# Patient Record
Sex: Male | Born: 1975 | Race: Black or African American | Hispanic: No | Marital: Married
Health system: Midwestern US, Community
[De-identification: ages and names within clinical notes are randomized; demographics above are authoritative.]

## PROBLEM LIST (undated history)

## (undated) DIAGNOSIS — E079 Disorder of thyroid, unspecified: Secondary | ICD-10-CM

## (undated) DIAGNOSIS — I4891 Unspecified atrial fibrillation: Secondary | ICD-10-CM

## (undated) HISTORY — PX: APPENDECTOMY: SHX54

---

## 2016-04-10 ENCOUNTER — Encounter (HOSPITAL_COMMUNITY): Payer: Self-pay | Admitting: Emergency Medicine

## 2016-04-10 ENCOUNTER — Emergency Department (HOSPITAL_COMMUNITY)
Admission: EM | Admit: 2016-04-10 | Discharge: 2016-04-10 | Disposition: A | Discharge status: Home / Self Care | Payer: Self-pay | Attending: Emergency Medicine | Admitting: Emergency Medicine

## 2016-04-10 DIAGNOSIS — K921 Melena: Secondary | ICD-10-CM

## 2016-04-10 DIAGNOSIS — K922 Gastrointestinal hemorrhage, unspecified: Secondary | ICD-10-CM | POA: Insufficient documentation

## 2016-04-10 LAB — CBC WITH DIFFERENTIAL/PLATELET
BASOS ABS: 0 10*3/uL (ref 0.0–0.1)
Basophils Relative: 0 %
Eosinophils Absolute: 0.4 10*3/uL (ref 0.0–0.7)
Eosinophils Relative: 8 %
HEMATOCRIT: 38.3 % — AB (ref 39.0–52.0)
Hemoglobin: 12.5 g/dL — ABNORMAL LOW (ref 13.0–17.0)
LYMPHS ABS: 2 10*3/uL (ref 0.7–4.0)
LYMPHS PCT: 40 %
MCH: 25.4 pg — AB (ref 26.0–34.0)
MCHC: 32.6 g/dL (ref 30.0–36.0)
MCV: 77.8 fL — AB (ref 78.0–100.0)
MONO ABS: 0.5 10*3/uL (ref 0.1–1.0)
MONOS PCT: 9 %
NEUTROS ABS: 2.1 10*3/uL (ref 1.7–7.7)
NEUTROS PCT: 43 %
Platelets: 161 10*3/uL (ref 150–400)
RBC: 4.92 MIL/uL (ref 4.22–5.81)
RDW: 13.2 % (ref 11.5–15.5)
WBC: 4.9 10*3/uL (ref 4.0–10.5)

## 2016-04-10 LAB — I-STAT CHEM 8, ED
BUN: 15 mg/dL (ref 6–20)
CHLORIDE: 105 mmol/L (ref 101–111)
Calcium, Ion: 1.07 mmol/L — ABNORMAL LOW (ref 1.15–1.40)
Creatinine, Ser: 1.3 mg/dL — ABNORMAL HIGH (ref 0.61–1.24)
GLUCOSE: 98 mg/dL (ref 65–99)
HCT: 37 % — ABNORMAL LOW (ref 39.0–52.0)
Hemoglobin: 12.6 g/dL — ABNORMAL LOW (ref 13.0–17.0)
POTASSIUM: 3.9 mmol/L (ref 3.5–5.1)
Sodium: 141 mmol/L (ref 135–145)
TCO2: 25 mmol/L (ref 0–100)

## 2016-04-10 LAB — POC OCCULT BLOOD, ED: FECAL OCCULT BLD: POSITIVE — AB

## 2016-04-10 LAB — BASIC METABOLIC PANEL
ANION GAP: 6 (ref 5–15)
BUN: 14 mg/dL (ref 6–20)
CHLORIDE: 108 mmol/L (ref 101–111)
CO2: 24 mmol/L (ref 22–32)
Calcium: 8.2 mg/dL — ABNORMAL LOW (ref 8.9–10.3)
Creatinine, Ser: 1.24 mg/dL (ref 0.61–1.24)
GFR calc non Af Amer: >60 mL/min (ref >60)
Glucose, Bld: 92 mg/dL (ref 65–99)
POTASSIUM: 4 mmol/L (ref 3.5–5.1)
SODIUM: 138 mmol/L (ref 135–145)

## 2016-04-10 MED ORDER — PANTOPRAZOLE SODIUM 40 MG IV SOLR
40.0000 mg | Freq: Once | INTRAVENOUS | Status: AC
  Administered 2016-04-10: 40 mg via INTRAVENOUS
  Filled 2016-04-10: qty 40

## 2016-04-10 MED ORDER — OMEPRAZOLE 20 MG PO CPDR: 20.0000 mg | DELAYED_RELEASE_CAPSULE | Freq: Two times a day (BID) | ORAL | 0 refills | Status: DC

## 2016-04-10 MED ORDER — SODIUM CHLORIDE 0.9 % IV BOLUS (SEPSIS)
1000.0000 mL | Freq: Once | INTRAVENOUS | Status: AC
  Administered 2016-04-10: 1000 mL via INTRAVENOUS

## 2016-04-10 MED ORDER — RANITIDINE HCL 150 MG PO CAPS: 150.0000 mg | ORAL_CAPSULE | Freq: Every day | ORAL | 0 refills | Status: DC

## 2016-04-10 NOTE — ED Notes (Signed)
Witnessed rectal exam performed by [employer] PA student.

## 2016-04-10 NOTE — ED Triage Notes (Signed)
Pt reports bright red blood coming from rectum for three days only when having bowel movement. Denies pain.

## 2016-04-10 NOTE — ED Provider Notes (Signed)
[facility]-EMERGENCY DEPT Provider Note   CSN: [id number] Arrival date & time: 04/10/16  4098     History   Chief Complaint Chief Complaint  Patient presents with  . Rectal Bleeding    BRBPR    HPI Vincent Morse is a 41 y.o. male.  HPI   41 year old male with history of GERD taking Zantac on occasion, presenting complaining of bloody stool. 3 days ago he has 4 episodes of dark bloody stool with blood when he wipes. Yesterday he had 2 episode of our movement both with dark blood and this morning he had another episode of dark stool with blood.  He reported no associated abdominal pain, back pain, rectal pain, or rectal itchiness with these episode. No report of fever, lightheadedness, dizziness. No recent travel, eating raw meat, seafood, or eating a lot of spicy food. No family history of colon cancer. Denies any abnormal weight change, night sweats. No recent trauma. He normally have bowel movement once daily and usually in the morning. Yesterday he took one amoxicillin thinking that it may be an infection. He also took some Pepto-Bismol. Prior surgical history of appendectomy. No other abnormal bleeding.      History reviewed. No pertinent past medical history.  There are no active problems to display for this patient.   Past Surgical History:  Procedure Laterality Date  . APPENDECTOMY         Home Medications    Prior to Admission medications   Not on File    Family History No family history on file.  Social History Social History  Substance Use Topics  . Smoking status: Never Smoker  . Smokeless tobacco: Not on file  . Alcohol use No     Allergies   Patient has no known allergies.   Review of Systems Review of Systems  All other systems reviewed and are negative.    Physical Exam Updated Vital Signs BP 131/81 (BP Location: Right Arm)   Pulse 83   Temp 98.6 F (37 C) (Oral)   Resp 18   Ht 6\' 3"  (1.905 m)   Wt 99.8 kg   SpO2 100%   BMI 27.50  kg/m   Physical Exam  Constitutional: He appears well-developed and well-nourished. No distress.  HENT:  Head: Atraumatic.  Eyes: Conjunctivae are normal.  Neck: Neck supple.  Cardiovascular: Normal rate and regular rhythm.   Pulmonary/Chest: Effort normal and breath sounds normal.  Abdominal: Soft. He exhibits no distension. There is no tenderness.  Genitourinary:  Genitourinary Comments: Chaperone present during exam.  No external hemorrhoid, normal rectal tone, melanotic stool on glove, no BRBPR.  No anal fissure, no rectal mass.  Neurological: He is alert.  Skin: No rash noted.  Psychiatric: He has a normal mood and affect.  Nursing note and vitals reviewed.    ED Treatments / Results  Labs (all labs ordered are listed, but only abnormal results are displayed) Labs Reviewed  BASIC METABOLIC PANEL - Abnormal; Notable for the following:       Result Value   Calcium 8.2 (*)    All other components within normal limits  CBC WITH DIFFERENTIAL/PLATELET - Abnormal; Notable for the following:    Hemoglobin 12.5 (*)    HCT 38.3 (*)    MCV 77.8 (*)    MCH 25.4 (*)    All other components within normal limits  POC OCCULT BLOOD, ED - Abnormal; Notable for the following:    Fecal Occult Bld POSITIVE (*)  All other components within normal limits  I-STAT CHEM 8, ED - Abnormal; Notable for the following:    Creatinine, Ser 1.30 (*)    Calcium, Ion 1.07 (*)    Hemoglobin 12.6 (*)    HCT 37.0 (*)    All other components within normal limits    EKG  EKG Interpretation None       Radiology No results found.  Procedures Procedures (including critical care time)  Procedure: Anoscopy  Diagnosis: Rectal bleeding  Procedure performed by: Fayrene HelperBowie Kade Demicco, PA-C  Informed Consent: Informed consent was obtained.  Verification: I have verified the correct patient, correct procedure, correct position, correct site/side, and available equipment.  Anesthesia/Sedation:  None  Procedure Note: Universal precautions were observed. An anoscope was easily passed. Under direct visualization there was no obvious evidence of fissure, hemorrhoid or other abnormality. There was no evidence of foreign body. Melanotic stool noted in rectal vault. The patient tolerated the procedure well. There was no blood loss. There were no complications.   Medications Ordered in ED Medications  pantoprazole (PROTONIX) injection 40 mg (40 mg Intravenous Given 04/10/16 0749)  sodium chloride 0.9 % bolus 1,000 mL (0 mLs Intravenous Stopped 04/10/16 0834)     Initial Impression / Assessment and Plan / ED Course  I have reviewed the triage vital signs and the nursing notes.  Pertinent labs & imaging results that were available during my care of the patient were reviewed by me and considered in my medical decision making (see chart for details).     BP 110/75 (BP Location: Right Arm)   Pulse 78   Temp 98.6 F (37 C) (Oral)   Resp 14   Ht 6\' 3"  (1.905 m)   Wt 99.8 kg   SpO2 98%   BMI 27.50 kg/m    Final Clinical Impressions(s) / ED Diagnoses   Final diagnoses:  Gastrointestinal hemorrhage with melena    New Prescriptions New Prescriptions   OMEPRAZOLE (PRILOSEC) 20 MG CAPSULE    Take 1 capsule (20 mg total) by mouth 2 (two) times daily before a meal.   RANITIDINE (ZANTAC) 150 MG CAPSULE    Take 1 capsule (150 mg total) by mouth daily.   7:57 AM Pt here with dark black stool x 3 days.  No associated pain, no systemic changes.  ON exam, pt does have melanotic stool, no BRBPR.  Does have hx of GERD, suspect upper GI bleed.  Hgb 12.5.  Does have mildly elevated Cr of 1.3.  IVF and protonix given, will consult GI for anticipated outpt EGD.    8:40 AM Labs reassuring.  It's been 3 days but sxs are improving each day.  Hgb stable, pt agrees to f/u with GI for further care.  Recommend avoid NSAIDs, will prescribe prilosec and zantac for sxs.  Return precaution given. Care  discussed with DR. Campos.   Fayrene HelperBowie Travin Marik, PA-C 04/10/16 [id number]    Nira ConnPedro Eduardo Cardama, MD 04/12/16 1600

## 2016-04-10 NOTE — Discharge Instructions (Signed)
You have a GI bleeding that will benefit from close follow up with GI specialist.  Please call office today to schedule a follow up.  Avoid taking any anti-inflammatory medications (aleve, BP powder, Goodies Powder, ibuprofen, motrin, aspirin, etc..) as these can cause GI bleed.  Take prilosec and Zantac 30 minutes before meal.  Return to the ER if you have any concerns.

## 2016-12-12 ENCOUNTER — Encounter (HOSPITAL_COMMUNITY): Payer: Self-pay | Admitting: *Deleted

## 2016-12-12 ENCOUNTER — Emergency Department (HOSPITAL_COMMUNITY)
Admission: EM | Admit: 2016-12-12 | Discharge: 2016-12-12 | Disposition: A | Discharge status: Home / Self Care | Payer: Self-pay | Attending: Emergency Medicine | Admitting: Emergency Medicine

## 2016-12-12 ENCOUNTER — Other Ambulatory Visit: Payer: Self-pay

## 2016-12-12 DIAGNOSIS — R51 Headache: Secondary | ICD-10-CM | POA: Insufficient documentation

## 2016-12-12 DIAGNOSIS — R55 Syncope and collapse: Secondary | ICD-10-CM | POA: Insufficient documentation

## 2016-12-12 DIAGNOSIS — Z5321 Procedure and treatment not carried out due to patient leaving prior to being seen by health care provider: Secondary | ICD-10-CM | POA: Insufficient documentation

## 2016-12-12 NOTE — ED Notes (Signed)
Patient did not want to wait any longer .
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
## 2018-05-22 ENCOUNTER — Emergency Department (HOSPITAL_COMMUNITY)
Admission: EM | Admit: 2018-05-22 | Discharge: 2018-05-22 | Disposition: A | Discharge status: Home / Self Care | Payer: HRSA Program | Attending: Emergency Medicine | Admitting: Emergency Medicine

## 2018-05-22 ENCOUNTER — Emergency Department (HOSPITAL_COMMUNITY): Payer: HRSA Program

## 2018-05-22 ENCOUNTER — Encounter (HOSPITAL_COMMUNITY): Payer: Self-pay

## 2018-05-22 ENCOUNTER — Other Ambulatory Visit: Payer: Self-pay

## 2018-05-22 DIAGNOSIS — R05 Cough: Secondary | ICD-10-CM

## 2018-05-22 DIAGNOSIS — Z20822 Contact with and (suspected) exposure to covid-19: Secondary | ICD-10-CM

## 2018-05-22 DIAGNOSIS — R059 Cough, unspecified: Secondary | ICD-10-CM

## 2018-05-22 DIAGNOSIS — Z20828 Contact with and (suspected) exposure to other viral communicable diseases: Secondary | ICD-10-CM | POA: Diagnosis not present

## 2018-05-22 DIAGNOSIS — J069 Acute upper respiratory infection, unspecified: Secondary | ICD-10-CM

## 2018-05-22 DIAGNOSIS — Z79899 Other long term (current) drug therapy: Secondary | ICD-10-CM | POA: Insufficient documentation

## 2018-05-22 DIAGNOSIS — R6889 Other general symptoms and signs: Secondary | ICD-10-CM

## 2018-05-22 NOTE — Discharge Instructions (Addendum)
It's possible that you have the novel coronavirus infection, we are not currently testing patients unless they meet criteria for testing, which you did not. We are asking patients to self-quarantine for at least 7 days from symptom onset and 3 days with no fever without medications. Continue to stay well-hydrated. Gargle warm salt water and spit it out and use chloraseptic spray as needed for sore throat. Continue to use Tylenol as needed for pain or fever; if you need to use something additional, you can use ibuprofen just use it sparingly. Use Mucinex/Robitussin/etc for cough suppression/expectoration of mucus. Use over the counter flonase and the netipot to help with nasal congestion. May consider over-the-counter Benadryl or other antihistamine like Claritin/Zyrtec/etc to decrease secretions and for help with your symptoms. Follow up with your primary care doctor in 5-7 days for recheck of ongoing symptoms. Return to emergency department for emergent changing or worsening of symptoms.

## 2018-05-22 NOTE — ED Notes (Signed)
Bed: WA03 Expected date:  Expected time:  Means of arrival:  Comments: R/O Covid 43 yo M fever for several days. Afebrile at present

## 2018-05-22 NOTE — ED Provider Notes (Signed)
[facility]-EMERGENCY DEPT Provider Note   CSN: [id number] Arrival date & time: 05/22/18  1716    History   Chief Complaint Chief Complaint  Patient presents with  . flu like symptoms    HPI    Vincent Morse is a 43 y.o. otherwise healthy male with no reported PMHx, who presents to the ED with complaints of URI symptoms for the last week.  Patient reports having mild body aches which then progressed to having subjective fever and chills.  He also reports having rhinorrhea which has since resolved.  He reports an occasional dry cough.  He also reports mild improving shortness of breath over the last few days, although states that currently that resolved.  He has been taking Tylenol, allergy medicine, and an African medicine which has been helping his symptoms.  No known aggravating factors.  Symptoms are mild and constant, but improving.  He does not have a thermometer so he is not sure how high his fever has been, but reports feeling feverish.  He is a non-smoker.  He recently traveled to [place] last week.  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  He denies having any sore throat, ear pain or drainage, chest pain, leg swelling, abdominal pain, nausea, vomiting, diarrhea, constipation, dysuria, hematuria, numbness, tingling, focal weakness, or any other complaints at this time.  The history is provided by the patient and medical records. No language interpreter was used.    No past medical history on file.  There are no active problems to display for this patient.   Past Surgical History:  Procedure Laterality Date  . APPENDECTOMY          Home Medications    Prior to Admission medications   Medication Sig Start Date End Date Taking? Authorizing Provider  amoxicillin (AMOXIL) 500 MG capsule Take 500 mg by mouth daily.    [provider]  omeprazole (PRILOSEC) 20 MG capsule Take  1 capsule (20 mg total) by mouth 2 (two) times daily before a meal. 04/10/16   Fayrene Helper, PA-C  ranitidine (ZANTAC) 150 MG capsule Take 1 capsule (150 mg total) by mouth daily. 04/10/16   Fayrene Helper, PA-C    Family History No family history on file.  Social History Social History   Tobacco Use  . Smoking status: Never Smoker  . Smokeless tobacco: Never Used  Substance Use Topics  . Alcohol use: No  . Drug use: No     Allergies   Patient has no known allergies.   Review of Systems Review of Systems  Constitutional: Positive for chills and fever.  HENT: Positive for rhinorrhea (now resolved). Negative for ear discharge, ear pain and sore throat.   Respiratory: Positive for cough and shortness of breath (resolved).   Cardiovascular: Negative for chest pain and leg swelling.  Gastrointestinal: Negative for abdominal pain, constipation, diarrhea, nausea and vomiting.  Genitourinary: Negative for dysuria and hematuria.  Musculoskeletal: Positive for myalgias. Negative for arthralgias.  Skin: Negative for color change.  Allergic/Immunologic: Negative for immunocompromised state.  Neurological: Negative for weakness and numbness.  Psychiatric/Behavioral: Negative for confusion.   All other systems reviewed and are negative for acute change except as noted in the HPI.    Physical Exam Updated Vital Signs BP 125/86   Pulse 71   Temp 97.7 F (36.5 C) (Oral)   Resp 18   SpO2 99%   Physical  Exam Vitals signs and nursing note reviewed.  Constitutional:      General: He is not in acute distress.    Appearance: Normal appearance. He is well-developed. He is not toxic-appearing.     Comments: Afebrile, nontoxic, NAD  HENT:     Head: Normocephalic and atraumatic.     Nose: Congestion present.     Comments: Mild nasal congestion L>R    Mouth/Throat:     Mouth: Mucous membranes are moist.     Pharynx: Oropharynx is clear. Uvula midline. No pharyngeal swelling, oropharyngeal  exudate, posterior oropharyngeal erythema or uvula swelling.     Tonsils: No tonsillar exudate or tonsillar abscesses.     Comments: Oropharynx clear and moist, without uvular swelling or deviation, no trismus or drooling, no tonsillar swelling or erythema, no exudates.   Eyes:     General:        Right eye: No discharge.        Left eye: No discharge.     Conjunctiva/sclera: Conjunctivae normal.  Neck:     Musculoskeletal: Normal range of motion and neck supple.  Cardiovascular:     Rate and Rhythm: Normal rate and regular rhythm.     Pulses: Normal pulses.     Heart sounds: Normal heart sounds, S1 normal and S2 normal. No murmur. No friction rub. No gallop.   Pulmonary:     Effort: Pulmonary effort is normal. No respiratory distress.     Breath sounds: Normal breath sounds. No decreased breath sounds, wheezing, rhonchi or rales.     Comments: CTAB in all lung fields, no w/r/r, no hypoxia or increased WOB, speaking in full sentences, SpO2 99% on RA. Occasional mild dry cough noted during exam.  Abdominal:     General: Bowel sounds are normal. There is no distension.     Palpations: Abdomen is soft. Abdomen is not rigid.     Tenderness: There is no abdominal tenderness. There is no right CVA tenderness, left CVA tenderness, guarding or rebound. Negative signs include Murphy's sign and McBurney's sign.  Musculoskeletal: Normal range of motion.  Skin:    General: Skin is warm and dry.     Findings: No rash.  Neurological:     Mental Status: He is alert and oriented to person, place, and time.     Sensory: Sensation is intact. No sensory deficit.     Motor: Motor function is intact.  Psychiatric:        Mood and Affect: Mood and affect normal.        Behavior: Behavior normal.      ED Treatments / Results  Labs (all labs ordered are listed, but only abnormal results are displayed) Labs Reviewed - No data to display  EKG None  [facility]  Result Date:  05/22/2018 CLINICAL DATA:  Fever cough body ache short of breath EXAM: PORTABLE CHEST 1 VIEW COMPARISON:  None. FINDINGS: The heart size and mediastinal contours are within normal limits. Both lungs are clear. The visualized skeletal structures are unremarkable. IMPRESSION: No active disease. Electronically Signed   By: Jasmine PangKim  Fujinaga M.D.   On: 05/22/2018 18:25    Procedures Procedures (including critical care time)  Medications Ordered in ED Medications - No data to display   Initial Impression / Assessment and Plan / ED Course  I have reviewed the triage vital signs and the nursing notes.  Pertinent labs & imaging results that were available during my care of the patient  were reviewed by me and considered in my medical decision making (see chart for details).        43 y.o. male here with COVID19 like symptoms x1 week. On exam, afebrile and nontoxic, in NAD, respirations unlabored, clear lung exam, mild nasal congestion, throat clear, no abdominal tenderness. Reports having mild SOB which is improving and currently resolved. It's possible pt has COVID19, but doubt need for testing at this time. No tachycardia or hypoxia, doubt PE, doubt dissection/ACS, etc given lack of other symptoms. Will get CXR and reassess. Doubt need for labs or other emergent interventions currently. Will reassess shortly.   Kasim Grimsley was evaluated in Emergency Department on 05/22/2018 for the symptoms described in the history of present illness. He was evaluated in the context of the global COVID-19 pandemic, which necessitated consideration that the patient might be at risk for infection with the SARS-CoV-2 virus that causes COVID-19. Institutional protocols and algorithms that pertain to the evaluation of patients at risk for COVID-19 are in a state of rapid change based on information released by regulatory bodies including the CDC and federal and state organizations. These policies and algorithms were followed  during the patient's care in the ED.  6:31 PM CXR negative. Likely viral URI vs COVID19. Pt well appearing, with clear lung exam, unlabored breathing, and in no distress. Doubt need for further emergent work up at this time. Discussed self-quarantine guidelines. Advised OTC remedies for symptomatic relief, and f/up with PCP in 1wk for recheck. Strict return precautions advised. I explained the diagnosis and have given explicit precautions to return to the ER including for any other new or worsening symptoms. The patient understands and accepts the medical plan as it's been dictated and I have answered their questions. Discharge instructions concerning home care and prescriptions have been given. The patient is STABLE and is discharged to home in good condition.     Final Clinical Impressions(s) / ED Diagnoses   Final diagnoses:  Suspected Covid-19 Virus Infection  Upper respiratory tract infection, unspecified type  Cough    ED Discharge Orders    [address] [place], [place] 05/22/18 1831    Alvira Monday, MD 05/22/18 2125

## 2018-05-22 NOTE — ED Triage Notes (Addendum)
Pt arrived from home via EMS. Pt c/o fever, cough, body aches and SOB x 4 days. Pt works at a gas station. Pt states that he has been taking tylenol at home and does not have thermometer to report highest temp. Pt does report going to [place] last week. Pt is breathing unlabored and is able to talk in full sentences.     BP 132/60, HR 76, O2 98% RA, RR 16.

## 2018-12-15 ENCOUNTER — Encounter (HOSPITAL_COMMUNITY): Payer: Self-pay | Admitting: Emergency Medicine

## 2018-12-15 ENCOUNTER — Emergency Department (HOSPITAL_COMMUNITY)
Admission: EM | Admit: 2018-12-15 | Discharge: 2018-12-15 | Disposition: A | Discharge status: Home / Self Care | Payer: Self-pay | Attending: Emergency Medicine | Admitting: Emergency Medicine

## 2018-12-15 ENCOUNTER — Emergency Department (HOSPITAL_COMMUNITY): Payer: Self-pay

## 2018-12-15 ENCOUNTER — Other Ambulatory Visit: Payer: Self-pay

## 2018-12-15 DIAGNOSIS — J111 Influenza due to unidentified influenza virus with other respiratory manifestations: Secondary | ICD-10-CM | POA: Insufficient documentation

## 2018-12-15 DIAGNOSIS — Z20828 Contact with and (suspected) exposure to other viral communicable diseases: Secondary | ICD-10-CM | POA: Insufficient documentation

## 2018-12-15 LAB — SARS CORONAVIRUS 2 (TAT 6-24 HRS): SARS Coronavirus 2: NEGATIVE

## 2018-12-15 LAB — GROUP A STREP BY PCR: Group A Strep by PCR: NOT DETECTED

## 2018-12-15 MED ORDER — IBUPROFEN 200 MG PO TABS
600.0000 mg | ORAL_TABLET | Freq: Once | ORAL | Status: AC
  Administered 2018-12-15: 600 mg via ORAL
  Filled 2018-12-15: qty 1

## 2018-12-15 MED ORDER — ACETAMINOPHEN 325 MG PO TABS
650.0000 mg | ORAL_TABLET | Freq: Once | ORAL | Status: AC
  Administered 2018-12-15: 650 mg via ORAL
  Filled 2018-12-15: qty 2

## 2018-12-15 NOTE — ED Triage Notes (Signed)
Pt in with fever, sore throat and congestion x 5 days. States he feels weaker today. Denies any coughing or sick contacts, does work at busy gas station. sats 100% in triage, breathing e/u

## 2018-12-15 NOTE — ED Provider Notes (Signed)
[facility] EMERGENCY DEPARTMENT Provider Note   CSN: [id number] Arrival date & time: 12/15/18  1448     History   Chief Complaint Chief Complaint  Patient presents with  . Sore Throat  . Fever    HPI Vincent Morse is a 43 y.o. male.     The history is provided by the patient.  Sore Throat This is a new problem. The current episode started more than 2 days ago. The problem occurs daily. The problem has not changed since onset.Associated symptoms include shortness of breath. Pertinent negatives include no chest pain and no abdominal pain. Nothing aggravates the symptoms. The symptoms are relieved by acetaminophen. He has tried acetaminophen for the symptoms. The treatment provided mild relief.  Fever Temp source:  Subjective Severity:  Mild Onset quality:  Gradual Associated symptoms: no chest pain, no chills, no cough, no dysuria, no ear pain, no rash, no sore throat and no vomiting     History reviewed. No pertinent past medical history.  There are no active problems to display for this patient.   Past Surgical History:  Procedure Laterality Date  . APPENDECTOMY          Home Medications    Prior to Admission medications   Medication Sig Start Date End Date Taking? Authorizing Provider  acetaminophen (TYLENOL) 325 MG tablet Take 650 mg by mouth every 6 (six) hours as needed for moderate pain or fever.    [provider]  amoxicillin (AMOXIL) 500 MG capsule Take 500 mg by mouth daily.    [provider]  Multiple Vitamins-Minerals (AIRBORNE PO) Take by mouth.    [provider]  omeprazole (PRILOSEC) 20 MG capsule Take 1 capsule (20 mg total) by mouth 2 (two) times daily before a meal. Patient not taking: Reported on 05/22/2018 04/10/16   Fayrene Helper, PA-C  ranitidine (ZANTAC) 150 MG capsule Take 1 capsule (150 mg total) by mouth daily. Patient not taking: Reported on 05/22/2018 04/10/16   Fayrene Helper, PA-C    Family  History No family history on file.  Social History Social History   Tobacco Use  . Smoking status: Never Smoker  . Smokeless tobacco: Never Used  Substance Use Topics  . Alcohol use: No  . Drug use: No     Allergies   Patient has no known allergies.   Review of Systems Review of Systems  Constitutional: Positive for fever. Negative for chills.  HENT: Negative for ear pain and sore throat.   Eyes: Negative for pain and visual disturbance.  Respiratory: Positive for shortness of breath. Negative for cough.   Cardiovascular: Negative for chest pain and palpitations.  Gastrointestinal: Negative for abdominal pain and vomiting.  Genitourinary: Negative for dysuria and hematuria.  Musculoskeletal: Negative for arthralgias and back pain.  Skin: Negative for color change and rash.  Neurological: Negative for seizures and syncope.  All other systems reviewed and are negative.    Physical Exam Updated Vital Signs  ED Triage Vitals  Enc Vitals Group     BP 12/15/18 0623 (!) 145/96     Pulse Rate 12/15/18 0623 (!) 124     Resp 12/15/18 0623 (!) 23     Temp 12/15/18 0623 99.5 F (37.5 C)     Temp Source 12/15/18 0623 Oral     SpO2 12/15/18 0623 100 %     Weight 12/15/18 0632 229 lb 15 oz (104.3 kg)     Height --  Head Circumference --      Peak Flow --      Pain Score 12/15/18 0737 8     Pain Loc --      Pain Edu? --      Excl. in GC? --     Physical Exam Vitals signs and nursing note reviewed.  Constitutional:      General: He is not in acute distress.    Appearance: He is well-developed. He is not ill-appearing.  HENT:     Head: Normocephalic and atraumatic.     Nose: No congestion.     Mouth/Throat:     Mouth: Mucous membranes are moist.     Tonsils: No tonsillar exudate or tonsillar abscesses.  Eyes:     Conjunctiva/sclera: Conjunctivae normal.     Pupils: Pupils are equal, round, and reactive to light.  Neck:     Musculoskeletal: Normal range of  motion and neck supple.  Cardiovascular:     Rate and Rhythm: Normal rate and regular rhythm.     Heart sounds: Normal heart sounds. No murmur.  Pulmonary:     Effort: Pulmonary effort is normal. No respiratory distress.     Breath sounds: Normal breath sounds.  Abdominal:     Palpations: Abdomen is soft.     Tenderness: There is no abdominal tenderness.  Skin:    General: Skin is warm and dry.  Neurological:     General: No focal deficit present.     Mental Status: He is alert.  Psychiatric:        Mood and Affect: Mood normal.      ED Treatments / Results  Labs (all labs ordered are listed, but only abnormal results are displayed) Labs Reviewed  GROUP A STREP BY PCR  SARS CORONAVIRUS 2 (TAT 6-24 HRS)    EKG None  Radiology Dg Chest Portable 1 View  Result Date: 12/15/2018 CLINICAL DATA:  43 year old with cough, fever and sore throat. EXAM: PORTABLE CHEST 1 VIEW COMPARISON:  05/22/2018 FINDINGS: Single view of the chest was obtained. The lungs are clear without airspace disease or edema. Heart and mediastinum are within normal limits. Trachea is midline. Negative for a pneumothorax. Bone structures are unremarkable. IMPRESSION: No acute cardiopulmonary disease. Electronically Signed   By: Richarda OverlieAdam  Henn M.D.   On: 12/15/2018 08:27    Procedures Procedures (including critical care time)  Medications Ordered in ED Medications  acetaminophen (TYLENOL) tablet 650 mg (650 mg Oral Given 12/15/18 0759)  ibuprofen (ADVIL) tablet 600 mg (600 mg Oral Given 12/15/18 0800)     Initial Impression / Assessment and Plan / ED Course  I have reviewed the triage vital signs and the nursing notes.  Pertinent labs & imaging results that were available during my care of the patient were reviewed by me and considered in my medical decision making (see chart for details).        Vincent Morse is a 43 year old male with no significant medical history who presents to the ED with fever,  sore throat, congestion for the last several days.  Patient with mild tachycardia but otherwise normal vitals.  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  Denies any severe coughing or shortness of breath.  Patient with clear breath sounds.  No signs of increased work of breathing.  Throat without any signs of obvious infection.  Strep screen unremarkable.  Chest x-ray showed no signs of infection.  Swab for coronavirus.  Heart rate improved  with both Tylenol and ibuprofen and p.o. fluids.  Recommend self-isolation at home as suspect patient has viral process, likely coronavirus.  Understands return precautions.  This chart was dictated using voice recognition software.  Despite best efforts to proofread,  errors can occur which can change the documentation meaning.  Vincent Morse was evaluated in Emergency Department on 12/15/2018 for the symptoms described in the history of present illness. He was evaluated in the context of the global COVID-19 pandemic, which necessitated consideration that the patient might be at risk for infection with the SARS-CoV-2 virus that causes COVID-19. Institutional protocols and algorithms that pertain to the evaluation of patients at risk for COVID-19 are in a state of rapid change based on information released by regulatory bodies including the CDC and federal and state organizations. These policies and algorithms were followed during the patient's care in the ED.   Final Clinical Impressions(s) / ED Diagnoses   Final diagnoses:  Influenza-like illness    ED Discharge Orders    None       Lennice Sites, DO 12/15/18 [phone]

## 2018-12-15 NOTE — Discharge Instructions (Signed)
Your strep pharyngitis test was negative.  Your coronavirus test will result over the next several days.  Please self isolate until you hear back about result.  Please return to the ED if your symptoms worsen.

## 2018-12-31 ENCOUNTER — Ambulatory Visit (HOSPITAL_COMMUNITY)
Admission: EM | Admit: 2018-12-31 | Discharge: 2018-12-31 | Disposition: A | Discharge status: Home / Self Care | Payer: Self-pay | Attending: Family Medicine | Admitting: Family Medicine

## 2018-12-31 ENCOUNTER — Encounter (HOSPITAL_COMMUNITY): Payer: Self-pay

## 2018-12-31 ENCOUNTER — Other Ambulatory Visit: Payer: Self-pay

## 2018-12-31 DIAGNOSIS — Z20828 Contact with and (suspected) exposure to other viral communicable diseases: Secondary | ICD-10-CM | POA: Insufficient documentation

## 2018-12-31 DIAGNOSIS — R519 Headache, unspecified: Secondary | ICD-10-CM | POA: Insufficient documentation

## 2018-12-31 DIAGNOSIS — R42 Dizziness and giddiness: Secondary | ICD-10-CM | POA: Insufficient documentation

## 2018-12-31 DIAGNOSIS — Z79899 Other long term (current) drug therapy: Secondary | ICD-10-CM | POA: Insufficient documentation

## 2018-12-31 DIAGNOSIS — R5383 Other fatigue: Secondary | ICD-10-CM | POA: Insufficient documentation

## 2018-12-31 LAB — COMPREHENSIVE METABOLIC PANEL
ALT: 50 U/L — ABNORMAL HIGH (ref 0–44)
AST: 36 U/L (ref 15–41)
Albumin: 3.5 g/dL (ref 3.5–5.0)
Alkaline Phosphatase: 67 U/L (ref 38–126)
Anion gap: 7 (ref 5–15)
BUN: 13 mg/dL (ref 6–20)
CO2: 26 mmol/L (ref 22–32)
Calcium: 9.7 mg/dL (ref 8.9–10.3)
Chloride: 104 mmol/L (ref 98–111)
Creatinine, Ser: 0.76 mg/dL (ref 0.61–1.24)
GFR calc Af Amer: >60 mL/min (ref >60)
GFR calc non Af Amer: >60 mL/min (ref >60)
Glucose, Bld: 127 mg/dL — ABNORMAL HIGH (ref 70–99)
Potassium: 4.5 mmol/L (ref 3.5–5.1)
Sodium: 137 mmol/L (ref 135–145)
Total Bilirubin: 0.7 mg/dL (ref 0.3–1.2)
Total Protein: 6.5 g/dL (ref 6.5–8.1)

## 2018-12-31 LAB — CBC WITH DIFFERENTIAL/PLATELET
Abs Immature Granulocytes: 0.01 10*3/uL (ref 0.00–0.07)
Basophils Absolute: 0 10*3/uL (ref 0.0–0.1)
Basophils Relative: 1 %
Eosinophils Absolute: 0.3 10*3/uL (ref 0.0–0.5)
Eosinophils Relative: 7 %
HCT: 42.7 % (ref 39.0–52.0)
Hemoglobin: 13.7 g/dL (ref 13.0–17.0)
Immature Granulocytes: 0 %
Lymphocytes Relative: 32 %
Lymphs Abs: 1.1 10*3/uL (ref 0.7–4.0)
MCH: 25.1 pg — ABNORMAL LOW (ref 26.0–34.0)
MCHC: 32.1 g/dL (ref 30.0–36.0)
MCV: 78.2 fL — ABNORMAL LOW (ref 80.0–100.0)
Monocytes Absolute: 0.6 10*3/uL (ref 0.1–1.0)
Monocytes Relative: 16 %
Neutro Abs: 1.6 10*3/uL — ABNORMAL LOW (ref 1.7–7.7)
Neutrophils Relative %: 44 %
Platelets: 192 10*3/uL (ref 150–400)
RBC: 5.46 MIL/uL (ref 4.22–5.81)
RDW: 12.6 % (ref 11.5–15.5)
WBC: 3.5 10*3/uL — ABNORMAL LOW (ref 4.0–10.5)
nRBC: 0 % (ref 0.0–0.2)

## 2018-12-31 LAB — LIPASE, BLOOD: Lipase: 44 U/L (ref 11–51)

## 2018-12-31 MED ORDER — ONDANSETRON 4 MG PO TBDP: 4.0000 mg | ORAL_TABLET | Freq: Three times a day (TID) | ORAL | 0 refills | Status: DC | PRN

## 2018-12-31 NOTE — Discharge Instructions (Signed)
Your ekg is overall reassuring today.  Please increase the fluid you are drinking, increase your water intake.  We will call you if there are any concerning results with your blood tests.  Please call today to establish care with a primary care provider, I have listed some options of clinics for you, you may need further evaluation if your symptoms persist.  Please be seen or go to the ER if you worsen, pass out, develop chest pain or otherwise worsening.

## 2018-12-31 NOTE — ED Provider Notes (Signed)
[facility]    CSN: [id number] Arrival date & time: 12/31/18  1213      History   Chief Complaint No chief complaint on file.   HPI Vincent Morse is a 43 y.o. male.   Vincent Morse presents with complaints of sensation of fever, states when he checks his temperature it is not elevated, however. Dizzy. Fatigued. Some headache's. Behind eyes, pain. Has been ongoing for the past month. At night feels like his heart races. No cough. No runny nose. No ear pain. Intermittent abdominal pain, worse when he takes medication, denies current abdominal pain. Nausea sensation, feels like he will pass out. He states he did pass out at work 5 days. No diarrhea. No vomiting. No current nausea. Has been taking ibuprofen and tylenol, they have not helped. Eating and drinking but has decreased appetite. Normal BM's, no blood or black. No recent travel. No known ill contacts. Feels some shoulder pain with certain arm movements. Took midol a few days ago, which caused some stomach upset.      History reviewed. No pertinent past medical history.  There are no active problems to display for this patient.   Past Surgical History:  Procedure Laterality Date  . APPENDECTOMY         Home Medications    Prior to Admission medications   Medication Sig Start Date End Date Taking? Authorizing Provider  acetaminophen (TYLENOL) 325 MG tablet Take 650 mg by mouth every 6 (six) hours as needed for moderate pain or fever.    [provider]  amoxicillin (AMOXIL) 500 MG capsule Take 500 mg by mouth daily.    [provider]  Multiple Vitamins-Minerals (AIRBORNE PO) Take by mouth.    [provider]  omeprazole (PRILOSEC) 20 MG capsule Take 1 capsule (20 mg total) by mouth 2 (two) times daily before a meal. Patient not taking: Reported on 05/22/2018 04/10/16   Fayrene Helper, PA-C  ondansetron (ZOFRAN-ODT) 4 MG disintegrating tablet Take 1 tablet (4 mg total) by mouth every 8  (eight) hours as needed for nausea or vomiting. 12/31/18   Georgetta Haber, NP  ranitidine (ZANTAC) 150 MG capsule Take 1 capsule (150 mg total) by mouth daily. Patient not taking: Reported on 05/22/2018 04/10/16   Fayrene Helper, PA-C    Family History Family History  Problem Relation Age of Onset  . Healthy Mother   . Healthy Father     Social History Social History   Tobacco Use  . Smoking status: Never Smoker  . Smokeless tobacco: Never Used  Substance Use Topics  . Alcohol use: No  . Drug use: No     Allergies   Patient has no known allergies.   Review of Systems Review of Systems   Physical Exam Triage Vital Signs ED Triage Vitals  Enc Vitals Group     BP 12/31/18 1344 130/76     Pulse Rate 12/31/18 1344 98     Resp 12/31/18 1344 20     Temp 12/31/18 1344 98.3 F (36.8 C)     Temp Source 12/31/18 1344 Oral     SpO2 12/31/18 1344 98 %     Weight 12/31/18 1346 229 lb 15 oz (104.3 kg)     Height --      Head Circumference --      Peak Flow --      Pain Score 12/31/18 1346 10     Pain Loc --      Pain Edu? --  Excl. in GC? --    No data found.  Updated Vital Signs BP 130/76 (BP Location: Right Arm)   Pulse 98   Temp 98.3 F (36.8 C) (Oral)   Resp 20   Wt 229 lb 15 oz (104.3 kg)   SpO2 98%   BMI 28.74 kg/m    Physical Exam Constitutional:      Appearance: He is well-developed.  HENT:     Right Ear: Hearing and tympanic membrane normal.     Left Ear: Hearing and tympanic membrane normal.  Cardiovascular:     Rate and Rhythm: Normal rate and regular rhythm.  Pulmonary:     Effort: Pulmonary effort is normal.     Breath sounds: Normal breath sounds.  Abdominal:     Palpations: Abdomen is soft.     Tenderness: There is abdominal tenderness in the suprapubic area. There is no right CVA tenderness, left CVA tenderness, guarding or rebound.     Comments: Very mild low midline abdominal discomfort on deep palpation; no rebound or guarding    Skin:    General: Skin is warm and dry.  Neurological:     Mental Status: He is alert and oriented to person, place, and time.    EKG:  NSR rate of 99 . Previous EKG was not available for review. Noacute stwave changes as interpreted by me.   Reviewed with supervising physician dr. Joseph Art   UC Treatments / Results  Labs (all labs ordered are listed, but only abnormal results are displayed) Labs Reviewed  NOVEL CORONAVIRUS, NAA (HOSP ORDER, SEND-OUT TO REF LAB; TAT 18-24 HRS)  CBC WITH DIFFERENTIAL/PLATELET  COMPREHENSIVE METABOLIC PANEL  LIPASE, BLOOD    EKG   Radiology No results found.  Procedures Procedures (including critical care time)  Medications Ordered in UC Medications - No data to display  Initial Impression / Assessment and Plan / UC Course  I have reviewed the triage vital signs and the nursing notes.  Pertinent labs & imaging results that were available during my care of the patient were reviewed by me and considered in my medical decision making (see chart for details).     Patient is overall non toxic in appearance without acute findings on physical findings. Afebrile. Pulse is elevated, but decreased from last visit in the ER. Fluids encouraged. Baseline labs obtained and pending. Will call patient for concerning findings. Supportive cares recommended at this time. Encouraged establish with PCP for recheck and establish care. Return precautions provided. Patient verbalized understanding and agreeable to plan.  Ambulatory out of clinic without difficulty.   Final Clinical Impressions(s) / UC Diagnoses   Final diagnoses:  Fatigue, unspecified type  Generalized headaches  Dizziness     Discharge Instructions     Your ekg is overall reassuring today.  Please increase the fluid you are drinking, increase your water intake.  We will call you if there are any concerning results with your blood tests.  Please call today to establish care with a  primary care provider, I have listed some options of clinics for you, you may need further evaluation if your symptoms persist.  Please be seen or go to the ER if you worsen, pass out, develop chest pain or otherwise worsening.     ED Prescriptions    Medication Sig Dispense Auth. Provider   ondansetron (ZOFRAN-ODT) 4 MG disintegrating tablet Take 1 tablet (4 mg total) by mouth every 8 (eight) hours as needed for nausea or vomiting. 12 tablet [place],  Barron Alvine B, NP     PDMP not reviewed this encounter.   Georgetta HaberBurky,  B, NP 12/31/18 1428

## 2018-12-31 NOTE — ED Triage Notes (Signed)
Pt. States he has felt sick for 1 month, his right side arm hurts, headaches, dizziness, body aches.

## 2019-01-02 LAB — NOVEL CORONAVIRUS, NAA (HOSP ORDER, SEND-OUT TO REF LAB; TAT 18-24 HRS): SARS-CoV-2, NAA: NOT DETECTED

## 2019-01-04 ENCOUNTER — Telehealth: Payer: Self-pay | Admitting: Emergency Medicine

## 2019-01-04 NOTE — Telephone Encounter (Signed)
Patient contacted by phone and made aware of lab   results. Pt verbalized understanding and had all questions answered.  

## 2019-01-04 NOTE — Telephone Encounter (Signed)
Attempted to reach patient. No answer at this time. Voicemail left. Mychart message sent with instructions.

## 2019-01-26 ENCOUNTER — Encounter (HOSPITAL_COMMUNITY): Payer: Self-pay | Admitting: Emergency Medicine

## 2019-01-26 ENCOUNTER — Other Ambulatory Visit: Payer: Self-pay

## 2019-01-26 ENCOUNTER — Ambulatory Visit (HOSPITAL_COMMUNITY)
Admission: EM | Admit: 2019-01-26 | Discharge: 2019-01-26 | Disposition: A | Discharge status: Home / Self Care | Payer: HRSA Program | Attending: Family Medicine | Admitting: Family Medicine

## 2019-01-26 ENCOUNTER — Emergency Department (HOSPITAL_COMMUNITY)
Admission: EM | Admit: 2019-01-26 | Discharge: 2019-01-26 | Disposition: A | Discharge status: Home / Self Care | Payer: Self-pay | Attending: Emergency Medicine | Admitting: Emergency Medicine

## 2019-01-26 ENCOUNTER — Encounter (HOSPITAL_COMMUNITY): Payer: Self-pay

## 2019-01-26 ENCOUNTER — Emergency Department (HOSPITAL_COMMUNITY): Admission: EM | Admit: 2019-01-26 | Payer: Self-pay | Source: Home / Self Care

## 2019-01-26 DIAGNOSIS — R7989 Other specified abnormal findings of blood chemistry: Secondary | ICD-10-CM | POA: Insufficient documentation

## 2019-01-26 DIAGNOSIS — R000 Tachycardia, unspecified: Secondary | ICD-10-CM | POA: Insufficient documentation

## 2019-01-26 DIAGNOSIS — Z79899 Other long term (current) drug therapy: Secondary | ICD-10-CM | POA: Diagnosis not present

## 2019-01-26 DIAGNOSIS — R05 Cough: Secondary | ICD-10-CM | POA: Insufficient documentation

## 2019-01-26 DIAGNOSIS — R0981 Nasal congestion: Secondary | ICD-10-CM | POA: Insufficient documentation

## 2019-01-26 DIAGNOSIS — R062 Wheezing: Secondary | ICD-10-CM | POA: Diagnosis not present

## 2019-01-26 DIAGNOSIS — Z20828 Contact with and (suspected) exposure to other viral communicable diseases: Secondary | ICD-10-CM | POA: Insufficient documentation

## 2019-01-26 DIAGNOSIS — R634 Abnormal weight loss: Secondary | ICD-10-CM

## 2019-01-26 DIAGNOSIS — R5383 Other fatigue: Secondary | ICD-10-CM | POA: Diagnosis not present

## 2019-01-26 DIAGNOSIS — R42 Dizziness and giddiness: Secondary | ICD-10-CM | POA: Insufficient documentation

## 2019-01-26 DIAGNOSIS — R002 Palpitations: Secondary | ICD-10-CM | POA: Insufficient documentation

## 2019-01-26 LAB — URINALYSIS, ROUTINE W REFLEX MICROSCOPIC
Bilirubin Urine: NEGATIVE
Glucose, UA: NEGATIVE mg/dL
Hgb urine dipstick: NEGATIVE
Ketones, ur: NEGATIVE mg/dL
Leukocytes,Ua: NEGATIVE
Nitrite: NEGATIVE
Protein, ur: NEGATIVE mg/dL
Specific Gravity, Urine: 1.015 (ref 1.005–1.030)
pH: 5 (ref 5.0–8.0)

## 2019-01-26 LAB — BASIC METABOLIC PANEL
Anion gap: 9 (ref 5–15)
BUN: 17 mg/dL (ref 6–20)
CO2: 23 mmol/L (ref 22–32)
Calcium: 9.2 mg/dL (ref 8.9–10.3)
Chloride: 106 mmol/L (ref 98–111)
Creatinine, Ser: 0.6 mg/dL — ABNORMAL LOW (ref 0.61–1.24)
GFR calc Af Amer: >60 mL/min (ref >60)
GFR calc non Af Amer: >60 mL/min (ref >60)
Glucose, Bld: 150 mg/dL — ABNORMAL HIGH (ref 70–99)
Potassium: 4.1 mmol/L (ref 3.5–5.1)
Sodium: 138 mmol/L (ref 135–145)

## 2019-01-26 LAB — CBC
HCT: 40.3 % (ref 39.0–52.0)
Hemoglobin: 12.5 g/dL — ABNORMAL LOW (ref 13.0–17.0)
MCH: 24.5 pg — ABNORMAL LOW (ref 26.0–34.0)
MCHC: 31 g/dL (ref 30.0–36.0)
MCV: 79 fL — ABNORMAL LOW (ref 80.0–100.0)
Platelets: 173 10*3/uL (ref 150–400)
RBC: 5.1 MIL/uL (ref 4.22–5.81)
RDW: 13.1 % (ref 11.5–15.5)
WBC: 4.2 10*3/uL (ref 4.0–10.5)
nRBC: 0 % (ref 0.0–0.2)

## 2019-01-26 LAB — TSH: TSH: <0.01 u[IU]/mL — ABNORMAL LOW (ref 0.350–4.500)

## 2019-01-26 LAB — T4, FREE: Free T4: >5.5 ng/dL — ABNORMAL HIGH (ref 0.61–1.12)

## 2019-01-26 MED ORDER — PROPRANOLOL HCL 20 MG PO TABS: 20.0000 mg | ORAL_TABLET | Freq: Two times a day (BID) | ORAL | 0 refills | Status: DC

## 2019-01-26 NOTE — ED Notes (Signed)
Feeling bad x 3 months ,  Saw blood in his stools  Red, states feels heat beating fast

## 2019-01-26 NOTE — Discharge Instructions (Addendum)
Please call to get an appointment both with your primary care doctor as well as an endocrinologist.  Your family doctor or endocrinologist can follow-up on the additional blood test that were performed today.  Recommend taking the prescribed propanolol.  Return for worsening symptoms.  Your EKG does look like you are in atrial fibrillation.  Please follow-up with a cardiologist in the office.

## 2019-01-26 NOTE — ED Provider Notes (Addendum)
[facility] Provider Note   CSN: [id number] Arrival date & time: 01/26/19  1132     History Chief Complaint  Patient presents with  . Dizziness    Vincent Morse is a 43 y.o. male.  43 yo M with a chief complaints of palpitations and blood through his GI tract.  The patient states that he notices blood in his nose and in his mouth if he coughs really hard and then has seen it once or twice when he wipes with toilet paper.  This been ongoing for about 3 to 4 months and has not resolved.  Patient also has felt somewhat lightheaded and dizzy and that his heart is been racing.  Also has had some weight loss over the past few months.  At some point he tried to take a diet supplement but denies any current use.  Denies any recent change in medications.  Denies recent travel.  Denies suspicious food intake.  He has some right trapezius pain if he turns his head far to the right side.  Denies any chest pain or shortness of breath.  Denies abdominal pain vomiting denies diarrhea  The history is provided by the patient.  Dizziness Associated symptoms: blood in stool   Associated symptoms: no chest pain, no diarrhea, no headaches, no palpitations, no shortness of breath and no vomiting   Illness Severity:  Moderate Onset quality:  Gradual Duration:  5 months Timing:  Constant Progression:  Worsening Chronicity:  New Associated symptoms: no abdominal pain, no chest pain, no congestion, no diarrhea, no fever, no headaches, no myalgias, no rash, no shortness of breath and no vomiting        History reviewed. No pertinent past medical history.  There are no problems to display for this patient.   Past Surgical History:  Procedure Laterality Date  . APPENDECTOMY         Family History  Problem Relation Age of Onset  . Healthy Mother   . Healthy Father     Social History   Tobacco Use  . Smoking status: Never Smoker  . Smokeless tobacco:  Never Used  Substance Use Topics  . Alcohol use: No  . Drug use: No    Home Medications Prior to Admission medications   Medication Sig Start Date End Date Taking? Authorizing Provider  propranolol (INDERAL) 20 MG tablet Take 1 tablet (20 mg total) by mouth 2 (two) times daily. 01/26/19 02/25/19  Milagros Lollykstra, Richard S, MD    Allergies    Patient has no known allergies.  Review of Systems   Review of Systems  Constitutional: Negative for chills and fever.  HENT: Negative for congestion and facial swelling.   Eyes: Negative for discharge and visual disturbance.  Respiratory: Negative for shortness of breath.   Cardiovascular: Negative for chest pain and palpitations.  Gastrointestinal: Positive for blood in stool. Negative for abdominal pain, diarrhea and vomiting.  Musculoskeletal: Negative for arthralgias and myalgias.  Skin: Negative for color change and rash.  Neurological: Positive for dizziness. Negative for tremors, syncope and headaches.  Psychiatric/Behavioral: Negative for confusion and dysphoric mood.    Physical Exam Updated Vital Signs BP (!) 132/93   Pulse (!) 113   Temp 98.5 F (36.9 C) (Oral)   Resp (!) 25   SpO2 98%   Physical Exam Vitals and nursing note reviewed.  Constitutional:      Appearance: He is well-developed.  HENT:     Head: Normocephalic and atraumatic.  Eyes:     Pupils: Pupils are equal, round, and reactive to light.  Neck:     Vascular: No JVD.  Cardiovascular:     Rate and Rhythm: Regular rhythm. Tachycardia present.     Heart sounds: No murmur. No friction rub. No gallop.   Pulmonary:     Effort: No respiratory distress.     Breath sounds: No wheezing.  Abdominal:     General: There is no distension.     Tenderness: There is no guarding or rebound.  Musculoskeletal:        General: Normal range of motion.     Cervical back: Normal range of motion and neck supple.  Skin:    Coloration: Skin is not pale.     Findings: No rash.    Neurological:     Mental Status: He is alert and oriented to person, place, and time.  Psychiatric:        Behavior: Behavior normal.     ED Results / Procedures / Treatments   Labs (all labs ordered are listed, but only abnormal results are displayed) Labs Reviewed  BASIC METABOLIC PANEL - Abnormal; Notable for the following components:      Result Value   Glucose, Bld 150 (*)    Creatinine, Ser 0.60 (*)    All other components within normal limits  CBC - Abnormal; Notable for the following components:   Hemoglobin 12.5 (*)    MCV 79.0 (*)    MCH 24.5 (*)    All other components within normal limits  TSH - Abnormal; Notable for the following components:   TSH <0.010 (*)    All other components within normal limits  T4 - Abnormal; Notable for the following components:   T4, Total >24.9 (*)    All other components within normal limits  THYROID PEROXIDASE ANTIBODY - Abnormal; Notable for the following components:   Thyroperoxidase Ab SerPl-aCnc 72 (*)    All other components within normal limits  T4, FREE - Abnormal; Notable for the following components:   Free T4 >5.50 (*)    All other components within normal limits  PARATHYROID HORMONE, INTACT (NO CA) - Abnormal; Notable for the following components:   PTH 9 (*)    All other components within normal limits  URINALYSIS, ROUTINE W REFLEX MICROSCOPIC    EKG EKG Interpretation  Date/Time:  Wednesday January 26 2019 16:11:48 EST Ventricular Rate:  102 PR Interval:  150 QRS Duration: 78 QT Interval:  341 QTC Calculation: 445 R Axis:   61 Text Interpretation: Atrial fibrillation Confirmed by Madalyn Rob [id number] on 01/26/2019 8:03:40 PM   Radiology No results found.  Procedures Procedures (including critical care time)  Medications Ordered in ED Medications - No data to display  ED Course  I have reviewed the triage vital signs and the nursing notes.  Pertinent labs & imaging results that were  available during my care of the patient were reviewed by me and considered in my medical decision making (see chart for details).    MDM Rules/Calculators/A&P                      43 yo M with chief complaints of lightheadedness and palpitations.  This is been going on for about 4 5 months.  He is also noticed some blood in different locations states that he smells of cough really hard and have some blood in his mouth or in the back of his nose.  Has noted a few times when he wipes with toilet paper and has had some burning to his rectum after bowel movements.  Rectal exam with no gross blood.  No hemorrhoids.  Hemoglobin appears to be stable.  No significant electrolyte abnormality.  Patient is in sinus rhythm on EKG.  In the room that he appears to be in a sinus arrhythmia.  With his weight loss will obtain thyroid studies.  Urged him to follow-up with the family doctor for further evaluation.  Repeat ECG is consistent with atrial fibrillation.  Patient's CHA2DS2-VASc score is 0.  We will have him follow-up in the A. fib clinic.  Rate controlled..  CHA2DS2/VAS Stroke Risk Points      N/A >= 2 Points: High Risk  1 - 1.99 Points: Medium Risk  0 Points: Low Risk    A final score could not be computed because of missing components.: Last  Change: N/A     This score determines the patient's risk of having a stroke if the  patient has atrial fibrillation.      This score is not applicable to this patient. Components are not  calculated.     8:15 AM:  I have discussed the diagnosis/risks/treatment options with the patient and believe the pt to be eligible for discharge home to follow-up with PCP. We also discussed returning to the ED immediately if new or worsening sx occur. We discussed the sx which are most concerning (e.g., sudden worsening pain, fever, inability to tolerate by mouth) that necessitate immediate return. Medications administered to the patient during their visit and any new  prescriptions provided to the patient are listed below.  Medications given during this visit Medications - No data to display   The patient appears reasonably screen and/or stabilized for discharge and I doubt any other medical condition or other [facility] requiring further screening, evaluation, or treatment in the ED at this time prior to discharge.    Final Clinical Impression(s) / ED Diagnoses Final diagnoses:  Lightheadedness  Low TSH level    Rx / DC Orders ED Discharge Orders         Ordered    propranolol (INDERAL) 20 MG tablet  2 times daily     01/26/19 1801              Melene Plan, [place] 02/07/19 [phone]

## 2019-01-26 NOTE — ED Provider Notes (Signed)
Brief signout note  43 year old male presents to ER with generalized malaise, palpitations for 3 to 4 months.  Noted to have A. fib, rate controlled, otherwise normal vital signs.  Labs within normal limits.  TSH, T4 ordered and pending at time of discharge.  Patient encouraged to have close follow-up with primary care.  Also provided information for A. fib clinic.  Prior to patient leaving the department, TSH resulted and was undetectable.  Concern for hyperthyroidism.  Patient will need additional lab studies and referral to endocrinology.  Will give Rx for propanolol 20mg  bid.  Reviewed this finding in detail with patient as well as need for close follow-up with primary care doctor and endocrinology as patient will likely need additional treatment once etiology of thyroid dysfunction has been identified. Vincent Morse already provided info for afib clinic f/u. He is well-appearing, symptoms have been going on for months, believe he is safe to pursue this as outpatient.  Case management stopped by and will help make sure patient has at least the PCP follow up.   [name], MD 01/26/19 [phone]

## 2019-01-26 NOTE — ED Triage Notes (Signed)
Pt reports dizziness, palpitations, near syncopal and fevers for 3 months. States he has been losing weight. Reports that he has had some blood from his nose and some rectal bleeding at times.

## 2019-01-26 NOTE — ED Triage Notes (Signed)
Pt present coughing, wheezing and nasal congestion. Symptom started 3 months ago but has not gotten better.

## 2019-01-26 NOTE — Care Management (Signed)
ED CM met with patient in hall way.  Patient reports having an appointment with [facility] 02/11/19, CM explained that we will try to move up appt to a sooner date, patient is agreeable. ED CM forwarded an in-basket message to clinic's CM to reach out to patient if  A cancellation should arise.  Patient does not have health insurance to assist with prescription, goodrx card provided.  

## 2019-01-26 NOTE — Discharge Instructions (Addendum)
Go to the ER for further evaluation

## 2019-01-26 NOTE — ED Provider Notes (Signed)
[facility]    CSN: [id number] Arrival date & time: 01/26/19  1031      History   Chief Complaint Chief Complaint  Patient presents with  . Nasal Congestion  . Wheezing  . Cough    HPI Vincent Morse is a 43 y.o. male.   Patient is a 43 year old male with no known medical history.  He presents today with multiple symptoms.  Reporting he has been sick for approximately 3 months.  Was seen here 3 weeks ago and feels like his symptoms are worsening since then.  He has had weakness, fatigue, weight loss, blood in stool and vomit, cough, nasal congestion, wheezing, waxing and waning fevers.  Reports also that his heart has been racing at times.  No pain.  He has been taking Midol for his symptoms.  Denies any recent traveling.  Was tested for Covid 3 weeks ago with negative results.  ROS per HPI    Wheezing Associated symptoms: cough   Cough Associated symptoms: wheezing     History reviewed. No pertinent past medical history.  There are no problems to display for this patient.   Past Surgical History:  Procedure Laterality Date  . APPENDECTOMY         Home Medications    Prior to Admission medications   Medication Sig Start Date End Date Taking? Authorizing Provider  acetaminophen (TYLENOL) 325 MG tablet Take 650 mg by mouth every 6 (six) hours as needed for moderate pain or fever.    [provider]  amoxicillin (AMOXIL) 500 MG capsule Take 500 mg by mouth daily.    [provider]  Multiple Vitamins-Minerals (AIRBORNE PO) Take by mouth.    [provider]  omeprazole (PRILOSEC) 20 MG capsule Take 1 capsule (20 mg total) by mouth 2 (two) times daily before a meal. Patient not taking: Reported on 05/22/2018 04/10/16   Fayrene Helper, PA-C  ondansetron (ZOFRAN-ODT) 4 MG disintegrating tablet Take 1 tablet (4 mg total) by mouth every 8 (eight) hours as needed for nausea or vomiting. 12/31/18   Georgetta Haber, NP  ranitidine (ZANTAC)  150 MG capsule Take 1 capsule (150 mg total) by mouth daily. Patient not taking: Reported on 05/22/2018 04/10/16   Fayrene Helper, PA-C    Family History Family History  Problem Relation Age of Onset  . Healthy Mother   . Healthy Father     Social History Social History   Tobacco Use  . Smoking status: Never Smoker  . Smokeless tobacco: Never Used  Substance Use Topics  . Alcohol use: No  . Drug use: No     Allergies   Patient has no known allergies.   Review of Systems Review of Systems  Respiratory: Positive for cough and wheezing.      Physical Exam Triage Vital Signs ED Triage Vitals  Enc Vitals Group     BP 01/26/19 1049 (!) 161/87     Pulse Rate 01/26/19 1049 (!) 113     Resp 01/26/19 1049 18     Temp 01/26/19 1049 98.9 F (37.2 C)     Temp Source 01/26/19 1049 Oral     SpO2 01/26/19 1049 100 %     Weight --      Height --      Head Circumference --      Peak Flow --      Pain Score 01/26/19 1050 10     Pain Loc --      Pain  Edu? --      Excl. in [place]? --    No data found.  Updated Vital Signs BP (!) 161/87 (BP Location: Left Arm)   Pulse (!) 113   Temp 98.9 F (37.2 C) (Oral)   Resp 18   SpO2 100%   Visual Acuity Right Eye Distance:   Left Eye Distance:   Bilateral Distance:    Right Eye Near:   Left Eye Near:    Bilateral Near:     Physical Exam Vitals and nursing note reviewed.  Constitutional:      Appearance: He is ill-appearing. He is not toxic-appearing.  HENT:     Head: Normocephalic and atraumatic.     Nose: Nose normal.  Eyes:     Conjunctiva/sclera: Conjunctivae normal.  Cardiovascular:     Rate and Rhythm: Tachycardia present.  Musculoskeletal:        General: Normal range of motion.  Skin:    General: Skin is warm.  Neurological:     Mental Status: He is alert.  Psychiatric:        Mood and Affect: Mood normal.      UC Treatments / Results  Labs (all labs ordered are listed, but only abnormal results are  displayed) Labs Reviewed  NOVEL CORONAVIRUS, NAA (HOSP ORDER, SEND-OUT TO REF LAB; TAT 18-24 HRS)    EKG   Radiology No results found.  Procedures Procedures (including critical care time)  Medications Ordered in UC Medications - No data to display  Initial Impression / Assessment and Plan / UC Course  I have reviewed the triage vital signs and the nursing notes.  Pertinent labs & imaging results that were available during my care of the patient were reviewed by me and considered in my medical decision making (see chart for details).     Based on multitude of symptoms that are concerning and tachycardia will send to the ER for further evaluation.  Final Clinical Impressions(s) / UC Diagnoses   Final diagnoses:  Fatigue, unspecified type  Loss of weight  Tachycardia     Discharge Instructions     Go to the ER for further evaluation.     ED Prescriptions    None     PDMP not reviewed this encounter.   Orvan July, NP 01/26/19 1130

## 2019-01-27 LAB — PARATHYROID HORMONE, INTACT (NO CA): PTH: 9 pg/mL — ABNORMAL LOW (ref 15–65)

## 2019-01-27 LAB — NOVEL CORONAVIRUS, NAA (HOSP ORDER, SEND-OUT TO REF LAB; TAT 18-24 HRS): SARS-CoV-2, NAA: NOT DETECTED

## 2019-01-27 LAB — THYROID PEROXIDASE ANTIBODY: Thyroperoxidase Ab SerPl-aCnc: 72 IU/mL — ABNORMAL HIGH (ref 0–34)

## 2019-01-27 LAB — T4: T4, Total: >24.9 ug/dL — ABNORMAL HIGH (ref 4.5–12.0)

## 2019-01-31 ENCOUNTER — Ambulatory Visit (HOSPITAL_COMMUNITY)
Admission: RE | Admit: 2019-01-31 | Discharge: 2019-01-31 | Disposition: A | Discharge status: Home / Self Care | Payer: Self-pay | Source: Ambulatory Visit | Attending: Nurse Practitioner | Admitting: Nurse Practitioner

## 2019-01-31 ENCOUNTER — Encounter (HOSPITAL_COMMUNITY): Payer: Self-pay | Admitting: Nurse Practitioner

## 2019-01-31 ENCOUNTER — Other Ambulatory Visit: Payer: Self-pay

## 2019-01-31 ENCOUNTER — Telehealth (HOSPITAL_COMMUNITY): Payer: Self-pay | Admitting: Licensed Clinical Social Worker

## 2019-01-31 VITALS — BP 120/70 | HR 89 | Ht 75.0 in | Wt 217.4 lb

## 2019-01-31 DIAGNOSIS — I4891 Unspecified atrial fibrillation: Secondary | ICD-10-CM

## 2019-01-31 DIAGNOSIS — Z79899 Other long term (current) drug therapy: Secondary | ICD-10-CM | POA: Insufficient documentation

## 2019-01-31 DIAGNOSIS — E0590 Thyrotoxicosis, unspecified without thyrotoxic crisis or storm: Secondary | ICD-10-CM | POA: Insufficient documentation

## 2019-01-31 NOTE — Progress Notes (Addendum)
Primary Care Physician: Patient, No Pcp Per Referring Physician: [facility] ER f/u    Vincent Morse is a 44 y.o. male with a h/o ER visit 12/30 at which time afib was noted. He was also found to have hyperthyroidism and asked to f/u with  PCP. He was rate controlled. He has been feeling poorly for months with fatigue, palpitations. He was started on propanolol 20 mg bid . F/u T4, T4 free were all abnormal.  F/u in afib clinic,01/31/19. He is set to see PCP  for first time 1/15. He is in SR today. He does feel somewhat better, less palpitations.. He did start propanolol 20 mg bid from the ER. He states no alcohol use, no tobacco, caffeine use. He states he has lost a lot of weight, feels nervous a lot. He did work at [employer], but stopped working there after he started feeling bad.  No insurance. Unsure about snoring as he lives alone.   Today, he denies symptoms of  chest pain, shortness of breath, orthopnea, PND, lower extremity edema, dizziness, presyncope, syncope, or neurologic sequela. The patient is tolerating medications without difficulties and is otherwise without complaint today.   No past medical history on file. Past Surgical History:  Procedure Laterality Date  . APPENDECTOMY      Current Outpatient Medications  Medication Sig Dispense Refill  . acetaminophen (TYLENOL) 325 MG tablet Take 650 mg by mouth every 6 (six) hours as needed for moderate pain or fever.    Marland Kitchen amoxicillin (AMOXIL) 500 MG capsule Take 500 mg by mouth daily.    . Multiple Vitamins-Minerals (AIRBORNE PO) Take by mouth.    Marland Kitchen omeprazole (PRILOSEC) 20 MG capsule Take 1 capsule (20 mg total) by mouth 2 (two) times daily before a meal. (Patient not taking: Reported on 05/22/2018) 30 capsule 0  . ondansetron (ZOFRAN-ODT) 4 MG disintegrating tablet Take 1 tablet (4 mg total) by mouth every 8 (eight) hours as needed for nausea or vomiting. 12 tablet 0  . propranolol (INDERAL) 20 MG tablet Take 1 tablet (20 mg total) by  mouth 2 (two) times daily. 60 tablet 0  . ranitidine (ZANTAC) 150 MG capsule Take 1 capsule (150 mg total) by mouth daily. (Patient not taking: Reported on 05/22/2018) 30 capsule 0   No current facility-administered medications for this encounter.    No Known Allergies  Social History   Socioeconomic History  . Marital status: Married    Spouse name: Not on file  . Number of children: Not on file  . Years of education: Not on file  . Highest education level: Not on file  Occupational History  . Not on file  Tobacco Use  . Smoking status: Never Smoker  . Smokeless tobacco: Never Used  Substance and Sexual Activity  . Alcohol use: No  . Drug use: No  . Sexual activity: Not on file  Other Topics Concern  . Not on file  Social History Narrative  . Not on file   Social Determinants of Health   Financial Resource Strain:   . Difficulty of Paying Living Expenses: Not on file  Food Insecurity:   . Worried About Programme researcher, broadcasting/film/video in the Last Year: Not on file  . Ran Out of Food in the Last Year: Not on file  Transportation Needs:   . Lack of Transportation (Medical): Not on file  . Lack of Transportation (Non-Medical): Not on file  Physical Activity:   . Days of Exercise per  Week: Not on file  . Minutes of Exercise per Session: Not on file  Stress:   . Feeling of Stress : Not on file  Social Connections:   . Frequency of Communication with Friends and Family: Not on file  . Frequency of Social Gatherings with Friends and Family: Not on file  . Attends Religious Services: Not on file  . Active Member of Clubs or Organizations: Not on file  . Attends Archivist Meetings: Not on file  . Marital Status: Not on file  Intimate Partner Violence:   . Fear of Current or Ex-Partner: Not on file  . Emotionally Abused: Not on file  . Physically Abused: Not on file  . Sexually Abused: Not on file    Family History  Problem Relation Age of Onset  . Healthy Mother   .  Healthy Father     ROS- All systems are reviewed and negative except as per the HPI above  Physical Exam: There were no vitals filed for this visit. Wt Readings from Last 3 Encounters:  12/31/18 104.3 kg  12/15/18 104.3 kg  12/12/16 104.3 kg    Labs: Lab Results  Component Value Date   NA 138 01/26/2019   K 4.1 01/26/2019   CL 106 01/26/2019   CO2 23 01/26/2019   GLUCOSE 150 (H) 01/26/2019   BUN 17 01/26/2019   CREATININE 0.60 (L) 01/26/2019   CALCIUM 9.2 01/26/2019   No results found for: INR No results found for: CHOL, HDL, LDLCALC, TRIG   GEN- The patient is well appearing, alert and oriented x 3 today.   Head- normocephalic, atraumatic Eyes-  Sclera clear, conjunctiva pink Ears- hearing intact Oropharynx- clear Neck- supple, no JVP Lymph- no cervical lymphadenopathy Lungs- Clear to ausculation bilaterally, normal work of breathing Heart- Regular rate and rhythm, no murmurs, rubs or gallops, PMI not laterally displaced GI- soft, NT, ND, + BS Extremities- no clubbing, cyanosis, or edema MS- no significant deformity or atrophy Skin- no rash or lesion Psych- euthymic mood, full affect Neuro- strength and sensation are intact  EKG-NSR at 89 bpm, pr int 174 bpm, qrs int 90 ms, qtc 420, st changes suggest early depolarization.     Assessment and Plan: 1. New onset afib in the setting of newly dx hyperthyroidism In SR today  General education re afib   Continue propanolol 20 mg bid  I feel if the thyroid is treated the afib will be less of an issue   2. Hyperthyroidism  F/u with PCP as planned 1/15  3. CHA2DS2VASc score of 0  No anticoagulation is needed at this time   I would like to order an echo but he currently does not have insurance  I will have the social worker contact pt regarding financial assistance  F/u with afib clinic in 3 months which will allow pt to have thyroid treated Will see about echo at that time   Butch Penny C. Tailyn Hantz, [facility] [address] [place], [place] [address] [phone]

## 2019-01-31 NOTE — Telephone Encounter (Signed)
CSW received consult to speak with pt about current lack of insurance.   Pt reports that he has not had insurance since 2009.  Pt had been working but did not have insurance through his job.  States that he had to stop working about 1.5 months ago due to his illness.  CSW discuss possibly enrolling in insurance through [facility] Act as patient might qualify for special enrollment period since he has recently lost his job- pt reports that with no income he would be unable to afford to pay for any insurance.  Pt states he has been in the [place] for a long time under a [employer] and currently lives with his cousin who is supporting him during this time.  CSW then spoke with pt about [employer] which if pt qualifies he would have his [facility] medical bills back paid for 240day and covered moving forward for 6 months.  Pt is agreeable to applying for this program and emailed pt the application and the non filing form for the IRS as patient has not filed taxes in several years.  CSW will continue to follow and assist as needed  Burna Sis, LCSW Clinical Social Worker [facility] Desk#: [phone] Cell#: [phone]

## 2019-02-04 ENCOUNTER — Other Ambulatory Visit: Payer: Self-pay

## 2019-02-04 ENCOUNTER — Encounter: Payer: Self-pay | Admitting: Emergency Medicine

## 2019-02-04 ENCOUNTER — Emergency Department
Admission: EM | Admit: 2019-02-04 | Discharge: 2019-02-04 | Disposition: A | Discharge status: Home / Self Care | Payer: Self-pay | Attending: Emergency Medicine | Admitting: Emergency Medicine

## 2019-02-04 DIAGNOSIS — Z5321 Procedure and treatment not carried out due to patient leaving prior to being seen by health care provider: Secondary | ICD-10-CM | POA: Insufficient documentation

## 2019-02-04 DIAGNOSIS — M25511 Pain in right shoulder: Secondary | ICD-10-CM | POA: Insufficient documentation

## 2019-02-04 NOTE — ED Triage Notes (Signed)
Patient ambulatory to triage with steady gait, without difficulty or distress noted, mask in place; pt reports rt shoulder pain with ROM of arm x 18mos; denies any specific injury; took 2 aleve PTA with some relief; denies any accomp symptoms or radiating pain

## 2019-02-04 NOTE — ED Notes (Signed)
No answer when called several times from lobby 

## 2019-02-04 NOTE — ED Notes (Addendum)
No answer when called several times from lobby 

## 2019-02-10 ENCOUNTER — Other Ambulatory Visit: Payer: Self-pay

## 2019-02-10 ENCOUNTER — Encounter (HOSPITAL_COMMUNITY): Payer: Self-pay | Admitting: Emergency Medicine

## 2019-02-10 ENCOUNTER — Emergency Department (HOSPITAL_COMMUNITY)
Admission: EM | Admit: 2019-02-10 | Discharge: 2019-02-10 | Disposition: A | Discharge status: Home / Self Care | Payer: Self-pay | Attending: Emergency Medicine | Admitting: Emergency Medicine

## 2019-02-10 ENCOUNTER — Emergency Department (HOSPITAL_COMMUNITY): Payer: Self-pay

## 2019-02-10 DIAGNOSIS — Z7901 Long term (current) use of anticoagulants: Secondary | ICD-10-CM | POA: Insufficient documentation

## 2019-02-10 DIAGNOSIS — R002 Palpitations: Secondary | ICD-10-CM | POA: Insufficient documentation

## 2019-02-10 HISTORY — DX: Unspecified atrial fibrillation: I48.91

## 2019-02-10 LAB — BASIC METABOLIC PANEL
Anion gap: 8 (ref 5–15)
BUN: 20 mg/dL (ref 6–20)
CO2: 25 mmol/L (ref 22–32)
Calcium: 9.4 mg/dL (ref 8.9–10.3)
Chloride: 106 mmol/L (ref 98–111)
Creatinine, Ser: 0.5 mg/dL — ABNORMAL LOW (ref 0.61–1.24)
GFR calc Af Amer: >60 mL/min (ref >60)
GFR calc non Af Amer: >60 mL/min (ref >60)
Glucose, Bld: 114 mg/dL — ABNORMAL HIGH (ref 70–99)
Potassium: 4 mmol/L (ref 3.5–5.1)
Sodium: 139 mmol/L (ref 135–145)

## 2019-02-10 LAB — CBC
HCT: 38.7 % — ABNORMAL LOW (ref 39.0–52.0)
Hemoglobin: 12.1 g/dL — ABNORMAL LOW (ref 13.0–17.0)
MCH: 24.4 pg — ABNORMAL LOW (ref 26.0–34.0)
MCHC: 31.3 g/dL (ref 30.0–36.0)
MCV: 78.2 fL — ABNORMAL LOW (ref 80.0–100.0)
Platelets: 159 10*3/uL (ref 150–400)
RBC: 4.95 MIL/uL (ref 4.22–5.81)
RDW: 13.8 % (ref 11.5–15.5)
WBC: 5.2 10*3/uL (ref 4.0–10.5)
nRBC: 0 % (ref 0.0–0.2)

## 2019-02-10 MED ORDER — SODIUM CHLORIDE 0.9% FLUSH: 3.0000 mL | Freq: Once | INTRAVENOUS | Status: DC

## 2019-02-10 NOTE — Discharge Instructions (Addendum)
Please return for any problem.  Follow-up with your regular care provider and cardiology as instructed. 

## 2019-02-10 NOTE — ED Provider Notes (Signed)
[facility]-EMERGENCY DEPT Provider Note   CSN: [id number] Arrival date & time: 02/10/19  6213     History Chief Complaint  Patient presents with  . Palpitations  . Shoulder Pain    right    Vincent Morse is a 44 y.o. male.  44 year old male with prior medical history as detailed below presents for evaluation of intermittent heart palpitations.  Patient denies current palpitations.  He denies associated chest pain or shortness of breath.  Patient reports that he has been diagnosed with intermittent atrial fibrillation.  He is compliant with his current dose of propanolol.  He denies syncopal or near syncopal symptoms.  He denies associated cough, shortness of breath, fever, or other specific complaint.  His complaint today appear to be longstanding.  He also complains of mild right-sided posterior shoulder discomfort.  This is worse with movement or heavy lifting.  This has been an ongoing issue for at least the last 2 weeks.  The history is provided by the patient and medical records.  Palpitations Palpitations quality:  Irregular Onset quality:  Gradual Timing:  Intermittent Progression:  Waxing and waning Chronicity:  New Relieved by:  Nothing Worsened by:  Nothing Associated symptoms: no chest pain and no cough   Shoulder Pain      Past Medical History:  Diagnosis Date  . Atrial fibrillation (HCC)     There are no problems to display for this patient.   Past Surgical History:  Procedure Laterality Date  . APPENDECTOMY         Family History  Problem Relation Age of Onset  . Healthy Mother   . Healthy Father     Social History   Tobacco Use  . Smoking status: Never Smoker  . Smokeless tobacco: Never Used  Substance Use Topics  . Alcohol use: No  . Drug use: No    Home Medications Prior to Admission medications   Medication Sig Start Date End Date Taking? Authorizing Provider  propranolol (INDERAL) 20 MG tablet Take 1 tablet  (20 mg total) by mouth 2 (two) times daily. 01/26/19 02/25/19  Milagros Loll, MD    Allergies    Patient has no known allergies.  Review of Systems   Review of Systems  Respiratory: Negative for cough.   Cardiovascular: Positive for palpitations. Negative for chest pain.  All other systems reviewed and are negative.   Physical Exam Updated Vital Signs BP (!) 154/84 (BP Location: Left Arm)   Pulse 92   Temp 98.3 F (36.8 C) (Oral)   Resp (!) 22   SpO2 100%   Physical Exam Vitals and nursing note reviewed.  Constitutional:      General: He is not in acute distress.    Appearance: Normal appearance. He is well-developed.  HENT:     Head: Normocephalic and atraumatic.  Eyes:     Conjunctiva/sclera: Conjunctivae normal.     Pupils: Pupils are equal, round, and reactive to light.  Cardiovascular:     Rate and Rhythm: Normal rate and regular rhythm.     Heart sounds: Normal heart sounds.  Pulmonary:     Effort: Pulmonary effort is normal. No respiratory distress.     Breath sounds: Normal breath sounds.  Abdominal:     General: There is no distension.     Palpations: Abdomen is soft.     Tenderness: There is no abdominal tenderness.  Musculoskeletal:        General: No deformity. Normal range of motion.  Cervical back: Normal range of motion and neck supple.     Comments: Mild tenderness to the right posterior shoulder - no stepoff, AROM is intact  Skin:    General: Skin is warm and dry.  Neurological:     Mental Status: He is alert and oriented to person, place, and time.     ED Results / Procedures / Treatments   Labs (all labs ordered are listed, but only abnormal results are displayed) Labs Reviewed  BASIC METABOLIC PANEL - Abnormal; Notable for the following components:      Result Value   Glucose, Bld 114 (*)    Creatinine, Ser 0.50 (*)    All other components within normal limits  CBC - Abnormal; Notable for the following components:   Hemoglobin  12.1 (*)    HCT 38.7 (*)    MCV 78.2 (*)    MCH 24.4 (*)    All other components within normal limits    EKG EKG Interpretation  Date/Time:  Thursday February 10 2019 07:20:02 EST Ventricular Rate:  90 PR Interval:    QRS Duration: 82 QT Interval:  339 QTC Calculation: 415 R Axis:   42 Text Interpretation: Sinus rhythm Minimal ST elevation, inferior leads Confirmed by Dene Gentry [phone]) on 02/10/2019 7:22:22 AM   Radiology DG Chest 2 View  Result Date: 02/10/2019 CLINICAL DATA:  Palpitations. EXAM: CHEST - 2 VIEW COMPARISON:  Chest radiograph 12/15/2018 FINDINGS: Heart size within normal limits. No evidence of airspace consolidation. No pleural effusion or pneumothorax. No acute bony abnormality. IMPRESSION: No evidence of acute cardiopulmonary abnormality. Electronically Signed   By: Kellie Simmering DO   On: 02/10/2019 07:48    Procedures Procedures (including critical care time)  Medications Ordered in ED Medications  sodium chloride flush (NS) 0.9 % injection 3 mL (has no administration in time range)    ED Course  I have reviewed the triage vital signs and the nursing notes.  Pertinent labs & imaging results that were available during my care of the patient were reviewed by me and considered in my medical decision making (see chart for details).    MDM Rules/Calculators/A&P                      MDM  Screen complete  Vincent Morse was evaluated in Emergency Department on 02/10/2019 for the symptoms described in the history of present illness. He was evaluated in the context of the global COVID-19 pandemic, which necessitated consideration that the patient might be at risk for infection with the SARS-CoV-2 virus that causes COVID-19. Institutional protocols and algorithms that pertain to the evaluation of patients at risk for COVID-19 are in a state of rapid change based on information released by regulatory bodies including the CDC and federal and state organizations.  These policies and algorithms were followed during the patient's care in the ED.  Patient presented for evaluation of reported palpitations.  Patient with prior history of atrial fibrillation.  He is compliant with his propranolol.  EKG today did not demonstrate A. Fib.  Screening labs obtained are without significant abnormality.  Patient does feel improved.  Of note, his reported right shoulder discomfort is muscular in nature.  Patient does understand the need for close follow-up with both his regular care provider and with the atrial fibrillation clinic.  He is to see his regular care provider later this month for evaluation of his thyroid.  Importance of close follow-up is stressed.  Strict return  precautions given and understood.  Final Clinical Impression(s) / ED Diagnoses Final diagnoses:  Palpitations    Rx / DC Orders ED Discharge Orders    None       Wynetta Fines, MD 02/10/19 [phone]

## 2019-02-10 NOTE — ED Triage Notes (Signed)
Pt reports that he ben having heart palpitations  and right shoulder pains for while now. Pt recently dx with a fib, reports medications work for little bit then stops. Shoulder pains worse with movement.

## 2019-02-11 ENCOUNTER — Ambulatory Visit: Payer: Self-pay | Admitting: Family Medicine

## 2019-02-16 ENCOUNTER — Telehealth (HOSPITAL_COMMUNITY): Payer: Self-pay | Admitting: Licensed Clinical Social Worker

## 2019-02-16 NOTE — Telephone Encounter (Signed)
CSW called pt to check in regarding CAFA progress.  Unable to reach- left VM requesting call back.  Burna Sis, LCSW Clinical Social Worker [facility] Desk#: [phone] Cell#: [phone]

## 2019-02-24 ENCOUNTER — Ambulatory Visit: Payer: Self-pay | Attending: Family Medicine | Admitting: Family Medicine

## 2019-02-24 ENCOUNTER — Encounter: Payer: Self-pay | Admitting: Family Medicine

## 2019-02-24 ENCOUNTER — Other Ambulatory Visit: Payer: Self-pay

## 2019-02-24 VITALS — BP 147/78 | HR 97 | Temp 97.9°F | Wt 212.0 lb

## 2019-02-24 DIAGNOSIS — G8929 Other chronic pain: Secondary | ICD-10-CM

## 2019-02-24 DIAGNOSIS — J029 Acute pharyngitis, unspecified: Secondary | ICD-10-CM

## 2019-02-24 DIAGNOSIS — R002 Palpitations: Secondary | ICD-10-CM

## 2019-02-24 DIAGNOSIS — Z7689 Persons encountering health services in other specified circumstances: Secondary | ICD-10-CM

## 2019-02-24 DIAGNOSIS — Z09 Encounter for follow-up examination after completed treatment for conditions other than malignant neoplasm: Secondary | ICD-10-CM

## 2019-02-24 DIAGNOSIS — H6692 Otitis media, unspecified, left ear: Secondary | ICD-10-CM

## 2019-02-24 DIAGNOSIS — M25511 Pain in right shoulder: Secondary | ICD-10-CM

## 2019-02-24 DIAGNOSIS — R7989 Other specified abnormal findings of blood chemistry: Secondary | ICD-10-CM

## 2019-02-24 DIAGNOSIS — E0590 Thyrotoxicosis, unspecified without thyrotoxic crisis or storm: Secondary | ICD-10-CM

## 2019-02-24 MED ORDER — PROPRANOLOL HCL 20 MG PO TABS: 20.0000 mg | ORAL_TABLET | Freq: Two times a day (BID) | ORAL | 2 refills | Status: DC

## 2019-02-24 MED ORDER — AMOXICILLIN-POT CLAVULANATE 500-125 MG PO TABS: 1.0000 | ORAL_TABLET | Freq: Two times a day (BID) | ORAL | 0 refills | Status: DC

## 2019-02-24 MED ORDER — IBUPROFEN 600 MG PO TABS: 600.0000 mg | ORAL_TABLET | Freq: Three times a day (TID) | ORAL | 1 refills | Status: DC | PRN

## 2019-02-24 MED ORDER — METHIMAZOLE 10 MG PO TABS: 10.0000 mg | ORAL_TABLET | Freq: Every day | ORAL | 2 refills | Status: DC

## 2019-02-24 MED FILL — methIMAzole 10 MG TABS: 10 | 30 days supply | Qty: 30 | Fill #0

## 2019-02-24 MED FILL — IBUPROFEN 600 MG TABLET: 600 | 20 days supply | Qty: 60 | Fill #0

## 2019-02-24 MED FILL — PROPRANOLOL 20 MG TABLET: 20 | 30 days supply | Qty: 30 | Fill #0

## 2019-02-24 MED FILL — AMOX-CLAV 500-125 MG TABLET: 500-125 | 20 days supply | Qty: 20 | Fill #0

## 2019-02-24 NOTE — Patient Instructions (Signed)

## 2019-02-24 NOTE — Progress Notes (Signed)
Pt. Is here to establish care.  Pt. Is having sore throat.

## 2019-02-24 NOTE — Progress Notes (Signed)
Subjective:  Patient ID: Vincent Morse, male    DOB: 1975/06/20  Age: 44 y.o. MRN: [id number]  CC: Establish Care  Patient reported sore throat which can be a symptom of COVID-19 infection therefore patient was seen while wearing protective gear including N95 mask, goggles, face shield and disposable gown.  HPI Vincent Morse, 44 yo male new to the practice, who is status post hospital/ED evaluation for palpitations and right shoulder pain. Patient with past history of atrial fibrillation per chart and is on Propranolol. BP at the ED was elevated at 154/84. EKG done in the ED was negative for afib. Patient also with ED visit on 01/26/2019 due to dizziness and weakness and had abnormal thyroid blood tests with concern for hyperthyroidism. He was placed on propranolol 20 mg twice per day at that time.        At today's visit, patient reports that he does not feel well that he feels that he has a serious illness which no one has been able to find.  He also reports that in the past week, since last Saturday, he has had onset of a sore throat, nasal congestion, mild nonproductive cough as well as bilateral ear pressure.  Patient has developed painful achiness in the left ear x2 days.  He denies any change in hearing other than slight muffling of hearing in the left ear.  He does not believe that he has had fever but has not felt well.         He reports that he has not been feeling well for more than a month and up to 3 months.  He reports that he has lost weight without trying, he was previously having sensation of increased, pounding heart rate but this is improved with the use of Inderal but he still feels nervous/anxious.  He denies any actual chest pain or shortness of breath.  He feels fatigued and has difficulty sleeping.  He feels that he is still losing weight despite eating well/no significant change in the diet.  He denies any significant past medical history.  Chart lists a past history of atrial  fibrillation which patient is not aware of.  Past Medical History:  Diagnosis Date  . Atrial fibrillation [facility])     Past Surgical History:  Procedure Laterality Date  . APPENDECTOMY      Family History  Problem Relation Age of Onset  . Healthy Mother   . Healthy Father     Social History   Tobacco Use  . Smoking status: Never Smoker  . Smokeless tobacco: Never Used  Substance Use Topics  . Alcohol use: No    ROS Review of Systems  Constitutional: Positive for fatigue and unexpected weight change. Negative for chills and fever.  HENT: Positive for congestion, ear pain, postnasal drip, rhinorrhea, sinus pressure and sore throat. Negative for trouble swallowing.   Eyes: Negative for photophobia.  Respiratory: Positive for cough (Mild, nonproductive x1 week). Negative for shortness of breath.   Cardiovascular: Positive for palpitations. Negative for chest pain and leg swelling.  Gastrointestinal: Negative for abdominal pain, blood in stool, constipation, diarrhea and nausea.  Endocrine: Negative for polydipsia, polyphagia and polyuria.  Genitourinary: Negative for dysuria and frequency.  Musculoskeletal: Positive for arthralgias. Negative for gait problem.  Skin: Negative for rash and wound.  Neurological: Positive for dizziness, tremors (Felt jittery but improved with Inderal), weakness and headaches (Recent frontal headache). Negative for numbness.  Hematological: Negative for adenopathy. Does not bruise/bleed easily.  Psychiatric/Behavioral:  Positive for sleep disturbance. Negative for self-injury and suicidal ideas. The patient is nervous/anxious.     Objective:   Today's Vitals: BP (!) 147/78 (BP Location: Left Arm, Patient Position: Sitting, Cuff Size: Large)   Pulse 97   Temp 97.9 F (36.6 C) (Oral)   Wt 212 lb (96.2 kg)   SpO2 100%   BMI 26.50 kg/m   Physical Exam Vitals and nursing note reviewed.  Constitutional:      General: He is not in acute  distress.    Appearance: Normal appearance.     Comments: Well-nourished well-developed male in no acute distress but patient appears anxious.  Patient is wearing mask as per office COVID-19 protocol  HENT:     Right Ear: Ear canal and external ear normal.     Left Ear: Ear canal and external ear normal. Tympanic membrane is erythematous.     Ears:     Comments: Patient's right TM is dull and light pink but landmarks are visible.  Left TM is dark pink, slightly thickened and no visible landmarks    Nose: Mucosal edema, congestion and rhinorrhea present.     Comments: Mildly erythematous and moderately edematous nasal turbinates with clear to light yellow nasal discharge bilaterally.  Patient with mild tenderness over the ethmoid and maxillary sinuses bilaterally    Mouth/Throat:     Pharynx: Posterior oropharyngeal erythema present.     Comments: No exudate noted.  Patient with marked posterior pharynx erythema and cobblestoning Neck:     Vascular: No carotid bruit.     Comments: Patient with thyromegaly.  No tenderness to palpation over the thyroid area Cardiovascular:     Rate and Rhythm: Normal rate and regular rhythm.  Pulmonary:     Effort: Pulmonary effort is normal.     Breath sounds: Normal breath sounds.  Abdominal:     General: There is no distension.     Palpations: Abdomen is soft.     Tenderness: There is no abdominal tenderness. There is no right CVA tenderness, left CVA tenderness, guarding or rebound.  Musculoskeletal:        General: Tenderness present.     Cervical back: Normal range of motion and neck supple. No rigidity or tenderness.     Right lower leg: No edema.     Left lower leg: No edema.     Comments: Patient with difficulty lifting his right arm at the shoulder and cannot bring his arm to horizontal.  Patient with positive empty can sign on the right and patient has some give of the right arm against resistance.  He has some tenderness to palpation along the  posterior and anterior lateral shoulder and mid lateral upper arm near the shoulder  Lymphadenopathy:     Cervical: Cervical adenopathy present.  Skin:    General: Skin is warm and dry.  Neurological:     General: No focal deficit present.     Mental Status: He is alert and oriented to person, place, and time.  Psychiatric:        Behavior: Behavior normal.     Comments: Patient is anxious     Assessment & Plan:  1. Palpitations; encounter for examination after treatment at hospital; establish care Patient reports that his sensation of palpitations have improved with the use of Inderal which was refilled at today's visit.  Based on his complex of symptoms and on review of notes from his recent emergency department visits, patient symptoms are likely related to  hyperthyroidism.  Patient will have repeat thyroid panel with TSH but will also check basic metabolic panel to look for electrolyte abnormality as well as CBC to look for anemia in follow-up of his complaints of palpitations and fatigue. - Thyroid Panel With TSH - Basic Metabolic Panel - CBC - propranolol (INDERAL) 20 MG tablet; Take 1 tablet (20 mg total) by mouth 2 (two) times daily.  Dispense: 60 tablet; Refill: 2  2. Abnormal thyroid blood test 3. Hyperthyroidism On review of lab results from recent emergency department visits, patient with abnormal thyroid blood test suggestive of hyperthyroidism.  He will have repeat thyroid panel with TSH.  Based on T4 from December hospital visit, he was started on Tapazole 10 mg daily and he has been referred to endocrinology for further evaluation and treatment.  He is currently on Inderal which he states is controlling the sensation of pounding heart rate.  He has been asked to return for reevaluation in approximately 2 weeks and to go to the emergency department if he is feeling worse. Up to Date consulted/reviewed for initiation of treatment for hyperthyroidism.  Information on  hyperthyroidism provided to patient as part of after visit summary. - Ambulatory referral to Endocrinology - methimazole (TAPAZOLE) 10 MG tablet; Take 1 tablet (10 mg total) by mouth daily.  Dispense: 30 tablet; Refill: 2 - propranolol (INDERAL) 20 MG tablet; Take 1 tablet (20 mg total) by mouth 2 (two) times daily.  Dispense: 60 tablet; Refill: 2 -Thyroid panel with TSH Addendum: Patient's thyroid blood work shows increased T4 level and Tapazole will be increased to 20 mg daily.  Patient will be notified.  4. Left otitis media, unspecified otitis media type On examination, patient with findings consistent with left otitis media and URI.  He will be placed on Augmentin 500 mg twice daily x10 days which he is to take after eating.  Patient also with some symptoms suggestive of sinusitis in 10-day course would treat both otitis media and sinusitis at present. - amoxicillin-clavulanate (AUGMENTIN) 500-125 MG tablet; Take 1 tablet (500 mg total) by mouth 2 (two) times daily. Take after eating  Dispense: 20 tablet; Refill: 0  5. Pharyngitis, unspecified etiology Patient with complaint of sore throat x1 week and symptoms could also be associated with COVID-19, he will have testing for COVID-19 here in the office.  He is also being prescribed Augmentin for treatment of left otitis media along with possible sinusitis as patient with some discolored nasal discharge, cobblestoning of the posterior pharynx.  No evidence of exudate suggestive of strep throat. - amoxicillin-clavulanate (AUGMENTIN) 500-125 MG tablet; Take 1 tablet (500 mg total) by mouth 2 (two) times daily. Take after eating  Dispense: 20 tablet; Refill: 0 - Novel Coronavirus, NAA ([facility])  6. Chronic right shoulder pain Patient with chronic right shoulder pain and on examination, he likely has a rotator cuff tendinitis or even partial tear.  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  Prescription provided for ibuprofen as he reports  that over-the-counter ibuprofen does help to decrease the pain.  He has been referred to orthopedics for further evaluation and treatment. - AMB referral to orthopedics - ibuprofen (ADVIL) 600 MG tablet; Take 1 tablet (600 mg total) by mouth every 8 (eight) hours as needed. For pain; take after eating  Dispense: 60 tablet; Refill: 1  *Patient encouraged to obtain paperwork at the front desk for information that he will need to bring back to the office to meet with financial counselor  for enrollment in a program administered by [facility] to help with medical cost and specialty referrals. CMA was asked to contact financial counselor in order to obtain a stat appointment for patient.  Outpatient Encounter Medications as of 02/24/2019  Medication Sig  . Ascorbic Acid (VITAMIN C) 1000 MG tablet Take 1,000 mg by mouth daily.  Marland Kitchen dextromethorphan-guaiFENesin (MUCINEX DM) 30-600 MG 12hr tablet Take 1 tablet by mouth 2 (two) times daily as needed for cough.  . propranolol (INDERAL) 20 MG tablet Take 1 tablet (20 mg total) by mouth 2 (two) times daily.   No facility-administered encounter medications on file as of 02/24/2019.    An After Visit Summary was printed and given to the patient.   Follow-up: Return in about 2 weeks (around 03/10/2019).  ED if any acute worsening of symptoms or any concerns.  More than 30 minutes spent on patient examination, discussion of illnesses in addition to additional 15 minutes on review of chart, review of medical literature regarding patient's medical condition and arranging for follow-up with specialists/note completion.   Cain Saupe MD

## 2019-02-25 ENCOUNTER — Other Ambulatory Visit: Payer: Self-pay | Admitting: Pharmacist

## 2019-02-25 DIAGNOSIS — E0590 Thyrotoxicosis, unspecified without thyrotoxic crisis or storm: Secondary | ICD-10-CM

## 2019-02-25 LAB — CBC
Hematocrit: 36.2 % — ABNORMAL LOW (ref 37.5–51.0)
Hemoglobin: 11.9 g/dL — ABNORMAL LOW (ref 13.0–17.7)
MCH: 24 pg — ABNORMAL LOW (ref 26.6–33.0)
MCHC: 32.9 g/dL (ref 31.5–35.7)
MCV: 73 fL — ABNORMAL LOW (ref 79–97)
Platelets: 150 x10E3/uL (ref 150–450)
RBC: 4.96 x10E6/uL (ref 4.14–5.80)
RDW: 13.6 % (ref 11.6–15.4)
WBC: 4.6 x10E3/uL (ref 3.4–10.8)

## 2019-02-25 LAB — BASIC METABOLIC PANEL WITH GFR
BUN/Creatinine Ratio: 15 (ref 9–20)
BUN: 9 mg/dL (ref 6–24)
CO2: 21 mmol/L (ref 20–29)
Calcium: 9.4 mg/dL (ref 8.7–10.2)
Chloride: 106 mmol/L (ref 96–106)
Creatinine, Ser: 0.61 mg/dL — ABNORMAL LOW (ref 0.76–1.27)
GFR calc Af Amer: 141 mL/min/1.73
GFR calc non Af Amer: 122 mL/min/1.73
Glucose: 105 mg/dL — ABNORMAL HIGH (ref 65–99)
Potassium: 4.3 mmol/L (ref 3.5–5.2)
Sodium: 143 mmol/L (ref 134–144)

## 2019-02-25 LAB — THYROID PANEL WITH TSH
Free Thyroxine Index: >12.8 — ABNORMAL HIGH (ref 1.2–4.9)
T3 Uptake Ratio: >56 % — ABNORMAL HIGH (ref 24–39)
T4, Total: 22.8 ug/dL (ref 4.5–12.0)
TSH: <0.005 u[IU]/mL — ABNORMAL LOW (ref 0.450–4.500)

## 2019-02-25 LAB — NOVEL CORONAVIRUS, NAA: SARS-CoV-2, NAA: NOT DETECTED

## 2019-02-25 MED ORDER — METHIMAZOLE 10 MG PO TABS: 20.0000 mg | ORAL_TABLET | Freq: Every day | ORAL | 2 refills | Status: DC

## 2019-02-26 ENCOUNTER — Emergency Department (HOSPITAL_COMMUNITY): Payer: Self-pay

## 2019-02-26 ENCOUNTER — Other Ambulatory Visit: Payer: Self-pay

## 2019-02-26 ENCOUNTER — Emergency Department (HOSPITAL_COMMUNITY)
Admission: EM | Admit: 2019-02-26 | Discharge: 2019-02-26 | Disposition: A | Discharge status: Home / Self Care | Payer: Self-pay | Attending: Emergency Medicine | Admitting: Emergency Medicine

## 2019-02-26 ENCOUNTER — Encounter (HOSPITAL_COMMUNITY): Payer: Self-pay | Admitting: Emergency Medicine

## 2019-02-26 DIAGNOSIS — Z79899 Other long term (current) drug therapy: Secondary | ICD-10-CM | POA: Insufficient documentation

## 2019-02-26 DIAGNOSIS — R112 Nausea with vomiting, unspecified: Secondary | ICD-10-CM | POA: Insufficient documentation

## 2019-02-26 DIAGNOSIS — E0590 Thyrotoxicosis, unspecified without thyrotoxic crisis or storm: Secondary | ICD-10-CM | POA: Insufficient documentation

## 2019-02-26 DIAGNOSIS — R42 Dizziness and giddiness: Secondary | ICD-10-CM | POA: Insufficient documentation

## 2019-02-26 DIAGNOSIS — Z20822 Contact with and (suspected) exposure to covid-19: Secondary | ICD-10-CM | POA: Insufficient documentation

## 2019-02-26 HISTORY — DX: Disorder of thyroid, unspecified: E07.9

## 2019-02-26 LAB — CBC
HCT: 37.5 % — ABNORMAL LOW (ref 39.0–52.0)
Hemoglobin: 11.5 g/dL — ABNORMAL LOW (ref 13.0–17.0)
MCH: 23.8 pg — ABNORMAL LOW (ref 26.0–34.0)
MCHC: 30.7 g/dL (ref 30.0–36.0)
MCV: 77.6 fL — ABNORMAL LOW (ref 80.0–100.0)
Platelets: 129 10*3/uL — ABNORMAL LOW (ref 150–400)
RBC: 4.83 MIL/uL (ref 4.22–5.81)
RDW: 14.1 % (ref 11.5–15.5)
WBC: 4.2 10*3/uL (ref 4.0–10.5)
nRBC: 0 % (ref 0.0–0.2)

## 2019-02-26 LAB — COMPREHENSIVE METABOLIC PANEL
ALT: 40 U/L (ref 0–44)
AST: 31 U/L (ref 15–41)
Albumin: 3.3 g/dL — ABNORMAL LOW (ref 3.5–5.0)
Alkaline Phosphatase: 96 U/L (ref 38–126)
Anion gap: 10 (ref 5–15)
BUN: 11 mg/dL (ref 6–20)
CO2: 23 mmol/L (ref 22–32)
Calcium: 9.5 mg/dL (ref 8.9–10.3)
Chloride: 104 mmol/L (ref 98–111)
Creatinine, Ser: 0.62 mg/dL (ref 0.61–1.24)
GFR calc Af Amer: >60 mL/min (ref >60)
GFR calc non Af Amer: >60 mL/min (ref >60)
Glucose, Bld: 104 mg/dL — ABNORMAL HIGH (ref 70–99)
Potassium: 3.9 mmol/L (ref 3.5–5.1)
Sodium: 137 mmol/L (ref 135–145)
Total Bilirubin: 0.8 mg/dL (ref 0.3–1.2)
Total Protein: 6 g/dL — ABNORMAL LOW (ref 6.5–8.1)

## 2019-02-26 LAB — POC SARS CORONAVIRUS 2 AG -  ED: SARS Coronavirus 2 Ag: NEGATIVE

## 2019-02-26 LAB — TSH: TSH: <0.01 u[IU]/mL — ABNORMAL LOW (ref 0.350–4.500)

## 2019-02-26 LAB — D-DIMER, QUANTITATIVE: D-Dimer, Quant: 0.65 ug/mL-FEU — ABNORMAL HIGH (ref 0.00–0.50)

## 2019-02-26 LAB — T4, FREE: Free T4: 4.96 ng/dL — ABNORMAL HIGH (ref 0.61–1.12)

## 2019-02-26 NOTE — ED Provider Notes (Addendum)
[facility] EMERGENCY DEPARTMENT Provider Note   CSN: [id number] Arrival date & time: 02/26/19  5361     History Chief Complaint  Patient presents with  . Dizziness  . Emesis    Brandon Dollins is a 44 y.o. male.  HPI 44 yo male ho paroxysmal a fib, palpitations, hyperthytoidism, presents complaining of dizziness, nausea and vomiting, began at 0200today.  Patient feels nausea, emesis nbnb x 3 times.  Patient seen multiple times, seen by pmnd two days ago- left om and pharyngitis started on augmentin .    Patient with some spinning sensation.  No injury.  Patient had headache earlier today to left temple with left eye watering which has resolved without intervention. Chills no fever. Patient on beta blocker.  Now on augmentin x 4 doses for pharyngitis and om- by pmd.  Just started tapizole for hyperthyroid.  Patient originally from [place]- not out of the country recently. Wife and 3 children in home  Past Medical History:  Diagnosis Date  . Atrial fibrillation (HCC)   . Thyroid disease     There are no problems to display for this patient.   Past Surgical History:  Procedure Laterality Date  . APPENDECTOMY         Family History  Problem Relation Age of Onset  . Healthy Mother   . Healthy Father     Social History   Tobacco Use  . Smoking status: Never Smoker  . Smokeless tobacco: Never Used  Substance Use Topics  . Alcohol use: No  . Drug use: No    Home Medications Prior to Admission medications   Medication Sig Start Date End Date Taking? Authorizing Provider  amoxicillin-clavulanate (AUGMENTIN) 500-125 MG tablet Take 1 tablet (500 mg total) by mouth 2 (two) times daily. Take after eating 02/24/19   Fulp, Cammie, MD  Ascorbic Acid (VITAMIN C) 1000 MG tablet Take 1,000 mg by mouth daily.    [provider]  dextromethorphan-guaiFENesin (MUCINEX DM) 30-600 MG 12hr tablet Take 1 tablet by mouth 2 (two) times daily as needed for cough.     [provider]  ibuprofen (ADVIL) 600 MG tablet Take 1 tablet (600 mg total) by mouth every 8 (eight) hours as needed. For pain; take after eating 02/24/19   Fulp, Cammie, MD  methimazole (TAPAZOLE) 10 MG tablet Take 2 tablets (20 mg total) by mouth daily. 02/25/19   Fulp, Cammie, MD  propranolol (INDERAL) 20 MG tablet Take 1 tablet (20 mg total) by mouth 2 (two) times daily. 02/24/19 03/26/19  Cain Saupe, MD    Allergies    Patient has no known allergies.  Review of Systems   Review of Systems  All other systems reviewed and are negative.   Physical Exam Updated Vital Signs BP (!) 157/73 (BP Location: Left Arm)   Pulse 92   Temp 98.1 F (36.7 C) (Oral)   Resp 18   SpO2 100%   Physical Exam Vitals and nursing note reviewed.  Constitutional:      General: He is not in acute distress.    Appearance: Normal appearance. He is normal weight. He is not ill-appearing.  HENT:     Head: Normocephalic and atraumatic.     Right Ear: External ear normal.     Left Ear: External ear normal.     Nose: Nose normal.     Mouth/Throat:     Mouth: Mucous membranes are moist.  Eyes:     Extraocular Movements: Extraocular movements  intact.     Conjunctiva/sclera: Conjunctivae normal.     Pupils: Pupils are equal, round, and reactive to light.  Neck:     Comments: Thyromegaly nontender, not warm Cardiovascular:     Rate and Rhythm: Normal rate and regular rhythm.  Pulmonary:     Effort: Pulmonary effort is normal.     Breath sounds: Normal breath sounds.  Abdominal:     General: Abdomen is flat.     Palpations: Abdomen is soft.  Musculoskeletal:        General: Normal range of motion.     Cervical back: Normal range of motion.  Skin:    General: Skin is warm.     Capillary Refill: Capillary refill takes less than 2 seconds.  Neurological:     General: No focal deficit present.     Mental Status: He is alert and oriented to person, place, and time.     Cranial Nerves: No  cranial nerve deficit.     Sensory: No sensory deficit.     Motor: No weakness.     Coordination: Coordination normal.     Gait: Gait normal.     Deep Tendon Reflexes: Reflexes abnormal.  Psychiatric:        Mood and Affect: Mood normal.     ED Results / Procedures / Treatments   Labs (all labs ordered are listed, but only abnormal results are displayed) Labs Reviewed - No data to display  EKG EKG Interpretation  Date/Time:  Saturday February 26 2019 07:45:02 EST Ventricular Rate:  95 PR Interval:    QRS Duration: 91 QT Interval:  340 QTC Calculation: 428 R Axis:   66 Text Interpretation: Sinus rhythm Baseline wander in lead(s) V4 V5 V6 No significant change since last tracing Confirmed by Margarita Grizzle [phone]) on 02/26/2019 9:54:09 AM   Radiology DG Chest Port 1 View  Result Date: 02/26/2019 CLINICAL DATA:  Patient with dizziness and shortness of breath. EXAM: PORTABLE CHEST 1 VIEW COMPARISON:  Chest radiograph 02/10/2019 FINDINGS: Monitoring leads overlie the patient. Normal cardiac and mediastinal contours. No consolidative pulmonary opacities. No pleural effusion or pneumothorax. IMPRESSION: No acute cardiopulmonary process. Electronically Signed   By: Annia Belt M.D.   On: 02/26/2019 08:33    Procedures Procedures (including critical care time)  Medications Ordered in ED Medications - No data to display  ED Course  I have reviewed the triage vital signs and the nursing notes.  Pertinent labs & imaging results that were available during my care of the patient were reviewed by me and considered in my medical decision making (see chart for details).    MDM Rules/Calculators/A&P                      44 year old male recently diagnosed with hypothyroidism, started on Tapazole, presents today with dizziness with nausea and vomiting.  Symptoms have resolved.  Patient has normal neurological exam.  Labs were rechecked.  He continues to have elevated T4 at 4.96 and TSH  less than 0.1.  His only been on Tapazole for 2 days.  He is mildly anemic with hemoglobin 11.5 but this is stable from prior.  Chemistry is normal.  Patient has history of paroxysmal atrial fibrillation.  EKG obtained and no evidence of A. fib today.  D-dimer is slightly elevated, but patient is not having symptoms of dyspnea now.  On my original evaluation, patient was tachypneic and tachycardic, and so D-dimer was obtained.  However, in the interim  his heart rate has normalized as has his respiratory rate.  I see respiratory rates up in the 30s noted by nursing, but on my reexam his respiratory rate was 20.  He does not feel dyspneic and his tachycardia has resolved.  I feel this could all represent his hyperthyroidism.  He has no calf tenderness I have low index of suspicion for PE at this time.  Patient states he continues to feel improved.  He feels comfortable going home and following up with his doctor as scheduled on Monday.  We discussed that he may benefit from ultrasound of his thyroid.  We have discussed return precautions and he voices understanding. Final Clinical Impression(s) / ED Diagnoses Final diagnoses:  Dizziness  Hyperthyroidism    Rx / DC Orders ED Discharge Orders    None       Pattricia Boss, MD 02/26/19 1013    Pattricia Boss, MD 02/26/19 1014

## 2019-02-26 NOTE — Discharge Instructions (Signed)
Follow-up as scheduled with your doctor on Monday You have had thyroid studies today Return to the emergency department if you have worsening symptoms especially increased shortness of breath, chest pain, or weakness on one side or the other

## 2019-02-26 NOTE — ED Triage Notes (Signed)
C/o feeling dizzy, L sided headache, and vomiting since 2am.  Denies pain at present.

## 2019-02-27 LAB — T3 UPTAKE: T3 Uptake Ratio: 55 % — ABNORMAL HIGH (ref 24–39)

## 2019-02-27 LAB — T3, FREE: T3, Free: 15 pg/mL — ABNORMAL HIGH (ref 2.0–4.4)

## 2019-03-09 MED FILL — methIMAzole 10 MG TABS: 10 | 30 days supply | Qty: 60 | Fill #0

## 2019-03-10 ENCOUNTER — Ambulatory Visit: Payer: Self-pay | Attending: Family Medicine | Admitting: Family Medicine

## 2019-03-10 ENCOUNTER — Encounter: Payer: Self-pay | Admitting: Family Medicine

## 2019-03-10 ENCOUNTER — Other Ambulatory Visit: Payer: Self-pay

## 2019-03-10 VITALS — BP 146/82 | HR 80 | Temp 98.2°F | Resp 18 | Ht 75.0 in | Wt 205.0 lb

## 2019-03-10 DIAGNOSIS — E010 Iodine-deficiency related diffuse (endemic) goiter: Secondary | ICD-10-CM | POA: Insufficient documentation

## 2019-03-10 DIAGNOSIS — G8929 Other chronic pain: Secondary | ICD-10-CM | POA: Insufficient documentation

## 2019-03-10 DIAGNOSIS — M25511 Pain in right shoulder: Secondary | ICD-10-CM

## 2019-03-10 DIAGNOSIS — E0590 Thyrotoxicosis, unspecified without thyrotoxic crisis or storm: Secondary | ICD-10-CM | POA: Insufficient documentation

## 2019-03-10 NOTE — Progress Notes (Signed)
Established Patient Office Visit  Subjective:  Patient ID: Vincent Morse, male    DOB: May 14, 1975  Age: 44 y.o. MRN: [id number]  CC:  Chief Complaint  Patient presents with  . Follow-up    HPI Vincent Morse presents for follow-up of hyperthyroidism and right shoulder pain. He reports that a few days after starting the increased dose of tapazole, he felt so much better. Appetite has returned and sore throat has resolved. No longer feels anxious. No chest pain or palpitations. His right shoulder pain has improved with use of ibuprofen which he takes about once per day. No abdominal pain or nausea. Feels really good today.  Past Medical History:  Diagnosis Date  . Atrial fibrillation ([place])   . Thyroid disease     Past Surgical History:  Procedure Laterality Date  . APPENDECTOMY      Family History  Problem Relation Age of Onset  . Healthy Mother   . Healthy Father     Social History   Socioeconomic History  . Marital status: Married    Spouse name: Not on file  . Number of children: Not on file  . Years of education: Not on file  . Highest education level: Not on file  Occupational History  . Not on file  Tobacco Use  . Smoking status: Never Smoker  . Smokeless tobacco: Never Used  Substance and Sexual Activity  . Alcohol use: No  . Drug use: No  . Sexual activity: Not Currently  Other Topics Concern  . Not on file  Social History Narrative  . Not on file   Social Determinants of Health   Financial Resource Strain:   . Difficulty of Paying Living Expenses: Not on file  Food Insecurity:   . Worried About Charity fundraiser in the Last Year: Not on file  . Ran Out of Food in the Last Year: Not on file  Transportation Needs:   . Lack of Transportation (Medical): Not on file  . Lack of Transportation (Non-Medical): Not on file  Physical Activity:   . Days of Exercise per Week: Not on file  . Minutes of Exercise per Session: Not on file  Stress:   .  Feeling of Stress : Not on file  Social Connections:   . Frequency of Communication with Friends and Family: Not on file  . Frequency of Social Gatherings with Friends and Family: Not on file  . Attends Religious Services: Not on file  . Active Member of Clubs or Organizations: Not on file  . Attends Archivist Meetings: Not on file  . Marital Status: Not on file  Intimate Partner Violence:   . Fear of Current or Ex-Partner: Not on file  . Emotionally Abused: Not on file  . Physically Abused: Not on file  . Sexually Abused: Not on file    Outpatient Medications Prior to Visit  Medication Sig Dispense Refill  . ibuprofen (ADVIL) 600 MG tablet Take 1 tablet (600 mg total) by mouth every 8 (eight) hours as needed. For pain; take after eating 60 tablet 1  . methimazole (TAPAZOLE) 10 MG tablet Take 2 tablets (20 mg total) by mouth daily. 60 tablet 2  . propranolol (INDERAL) 20 MG tablet Take 1 tablet (20 mg total) by mouth 2 (two) times daily. 60 tablet 2  . Ascorbic Acid (VITAMIN C) 1000 MG tablet Take 1,000 mg by mouth daily.    Marland Kitchen dextromethorphan-guaiFENesin (MUCINEX DM) 30-600 MG 12hr tablet Take 1 tablet  by mouth 2 (two) times daily as needed for cough.    Marland Kitchen amoxicillin-clavulanate (AUGMENTIN) 500-125 MG tablet Take 1 tablet (500 mg total) by mouth 2 (two) times daily. Take after eating 20 tablet 0   No facility-administered medications prior to visit.    No Known Allergies  ROS Review of Systems  Constitutional: Negative for chills, fatigue and fever.  HENT: Negative for sore throat and trouble swallowing.   Eyes: Negative for photophobia and visual disturbance.  Respiratory: Negative for cough and shortness of breath.   Cardiovascular: Negative for chest pain, palpitations and leg swelling.  Gastrointestinal: Negative for abdominal pain, blood in stool, constipation, diarrhea and nausea.  Endocrine: Negative for cold intolerance, heat intolerance, polydipsia,  polyphagia and polyuria.  Genitourinary: Negative for dysuria and frequency.  Musculoskeletal: Positive for arthralgias. Negative for back pain.  Neurological: Negative for dizziness, light-headedness, numbness and headaches.  Hematological: Negative for adenopathy. Does not bruise/bleed easily.  Psychiatric/Behavioral: Negative for sleep disturbance. The patient is not nervous/anxious.       Objective:    Physical Exam  Constitutional: He is oriented to person, place, and time. He appears well-developed and well-nourished.  Neck: Thyromegaly present.  Cardiovascular: Normal rate and regular rhythm.  Pulmonary/Chest: Effort normal and breath sounds normal.  Abdominal: Soft. There is no abdominal tenderness. There is no rebound and no guarding.  Musculoskeletal:        General: Tenderness (mild discomfort with palpation of right shoulder area) present. No edema.     Cervical back: Normal range of motion and neck supple.  Lymphadenopathy:    He has cervical adenopathy.  Neurological: He is alert and oriented to person, place, and time.  Skin: Skin is warm and dry.  Psychiatric: He has a normal mood and affect. His behavior is normal.  Very happy at today's visit because he feels so much better  Nursing note and vitals reviewed.   BP (!) 146/82 (BP Location: Left Arm, Patient Position: Sitting, Cuff Size: Large)   Pulse 80   Temp 98.2 F (36.8 C) (Oral)   Resp 18   Ht 6\' 3"  (1.905 m)   Wt 205 lb (93 kg)   SpO2 100%   BMI 25.62 kg/m  Wt Readings from Last 3 Encounters:  03/10/19 205 lb (93 kg)  02/26/19 212 lb (96.2 kg)  02/24/19 212 lb (96.2 kg)     Health Maintenance Due  Topic Date Due  . HIV Screening  08/20/1990      Lab Results  Component Value Date   TSH <0.010 (L) 02/26/2019   Lab Results  Component Value Date   WBC 4.2 02/26/2019   HGB 11.5 (L) 02/26/2019   HCT 37.5 (L) 02/26/2019   MCV 77.6 (L) 02/26/2019   PLT 129 (L) 02/26/2019   Lab Results    Component Value Date   NA 137 02/26/2019   K 3.9 02/26/2019   CO2 23 02/26/2019   GLUCOSE 104 (H) 02/26/2019   BUN 11 02/26/2019   CREATININE 0.62 02/26/2019   BILITOT 0.8 02/26/2019   ALKPHOS 96 02/26/2019   AST 31 02/26/2019   ALT 40 02/26/2019   PROT 6.0 (L) 02/26/2019   ALBUMIN 3.3 (L) 02/26/2019   CALCIUM 9.5 02/26/2019   ANIONGAP 10 02/26/2019   No results found for: CHOL No results found for: HDL No results found for: LDLCALC No results found for: TRIG No results found for: CHOLHDL No results found for: 02/28/2019    Assessment & Plan:  1. Hyperthyroidism Patient has responded well to current tapazole and feels much better. He will continue current dose and will be notified if a change in the dose of medication is needed based on his repeat thyroid blood work from today's visit. Referral is pending with endocrinology - US THYROID; Future - Thyroid Panel With TSH  2. Thyromegaly Likely has goiter related to hyperthyroidism. Will schedule for thyroid US for further evaluation - US THYROID; Future - Thyroid Panel With TSH  3. Chronic right shoulder pain Improved with use of ibuprofen prescribed at this last visit.   An After Visit Summary was printed and given to the patient.   Follow-up: Return in about 4 weeks (around 04/07/2019) for hyperthyroidism.   Cain Saupe, MD

## 2019-03-11 LAB — THYROID PANEL WITH TSH
Free Thyroxine Index: 7.3 — ABNORMAL HIGH (ref 1.2–4.9)
T3 Uptake Ratio: 42 % — ABNORMAL HIGH (ref 24–39)
T4, Total: 17.4 ug/dL — ABNORMAL HIGH (ref 4.5–12.0)
TSH: <0.005 u[IU]/mL — ABNORMAL LOW (ref 0.450–4.500)

## 2019-03-12 ENCOUNTER — Other Ambulatory Visit: Payer: Self-pay | Admitting: Family Medicine

## 2019-03-12 DIAGNOSIS — E0590 Thyrotoxicosis, unspecified without thyrotoxic crisis or storm: Secondary | ICD-10-CM

## 2019-03-12 MED ORDER — METHIMAZOLE 10 MG PO TABS: 20.0000 mg | ORAL_TABLET | Freq: Every day | ORAL | 2 refills | Status: DC

## 2019-03-17 ENCOUNTER — Ambulatory Visit (HOSPITAL_COMMUNITY)
Admission: RE | Admit: 2019-03-17 | Discharge: 2019-03-17 | Disposition: A | Discharge status: Home / Self Care | Payer: Self-pay | Source: Ambulatory Visit | Attending: Family Medicine | Admitting: Family Medicine

## 2019-03-17 ENCOUNTER — Other Ambulatory Visit: Payer: Self-pay

## 2019-03-17 DIAGNOSIS — E010 Iodine-deficiency related diffuse (endemic) goiter: Secondary | ICD-10-CM | POA: Insufficient documentation

## 2019-03-17 DIAGNOSIS — E0590 Thyrotoxicosis, unspecified without thyrotoxic crisis or storm: Secondary | ICD-10-CM | POA: Insufficient documentation

## 2019-03-18 ENCOUNTER — Encounter: Payer: Self-pay | Admitting: *Deleted

## 2019-03-22 ENCOUNTER — Ambulatory Visit (INDEPENDENT_AMBULATORY_CARE_PROVIDER_SITE_OTHER): Payer: Self-pay | Admitting: Endocrinology

## 2019-03-22 ENCOUNTER — Encounter: Payer: Self-pay | Admitting: Endocrinology

## 2019-03-22 ENCOUNTER — Other Ambulatory Visit: Payer: Self-pay

## 2019-03-22 DIAGNOSIS — E0590 Thyrotoxicosis, unspecified without thyrotoxic crisis or storm: Secondary | ICD-10-CM

## 2019-03-22 DIAGNOSIS — R002 Palpitations: Secondary | ICD-10-CM

## 2019-03-22 MED ORDER — PROPRANOLOL HCL 20 MG PO TABS: 10.0000 mg | ORAL_TABLET | Freq: Two times a day (BID) | ORAL | 1 refills | Status: DC

## 2019-03-22 NOTE — Patient Instructions (Signed)
Please continue the same methimazole. If ever you have fever while taking methimazole, stop it and call us, even if the reason is obvious, because of the risk of a rare side-effect. It is best to never miss the medication.  However, if you do miss it, next best is to double up the next time.   Please reduce the propranolol to 1/2 pill, twice per day. Please redo the blood tests in 1 week.  You don't have to be fasting, but please call ahead. Please come back for a follow-up appointment in 1 month, in person if possible.

## 2019-03-22 NOTE — Progress Notes (Signed)
Subjective:    Patient ID: Vincent Morse, male    DOB: April 17, 1975, 44 y.o.   MRN: [id number]  HPI  telehealth visit today via doxy video visit.  Alternatives to telehealth are presented to this patient, and the patient agrees to the telehealth visit. Pt is advised of the cost of the visit, and agrees to this, also.   Patient is in his car, and I am at the office.   Persons attending the telehealth visit: the patient and I Pt is referred by Dr Chapman Fitch, for hyperthyroidism.  Pt reports he was dx'ed with hyperthyroidism in early 2021.  He was rx'ed tapazole and inderal.  Since then, sxs of palpitations and muscle weakness are improved.  He has never had XRT to the anterior neck, or thyroid surgery.  He does not consume kelp or any other non-prescribed thyroid medication.  He has never been on amiodarone.   Past Medical History:  Diagnosis Date  . Atrial fibrillation ([place])   . Thyroid disease     Past Surgical History:  Procedure Laterality Date  . APPENDECTOMY      Social History   Socioeconomic History  . Marital status: Married    Spouse name: Not on file  . Number of children: Not on file  . Years of education: Not on file  . Highest education level: Not on file  Occupational History  . Not on file  Tobacco Use  . Smoking status: Never Smoker  . Smokeless tobacco: Never Used  Substance and Sexual Activity  . Alcohol use: No  . Drug use: No  . Sexual activity: Not Currently  Other Topics Concern  . Not on file  Social History Narrative  . Not on file   Social Determinants of Health   Financial Resource Strain:   . Difficulty of Paying Living Expenses: Not on file  Food Insecurity:   . Worried About Charity fundraiser in the Last Year: Not on file  . Ran Out of Food in the Last Year: Not on file  Transportation Needs:   . Lack of Transportation (Medical): Not on file  . Lack of Transportation (Non-Medical): Not on file  Physical Activity:   . Days of Exercise per  Week: Not on file  . Minutes of Exercise per Session: Not on file  Stress:   . Feeling of Stress : Not on file  Social Connections:   . Frequency of Communication with Friends and Family: Not on file  . Frequency of Social Gatherings with Friends and Family: Not on file  . Attends Religious Services: Not on file  . Active Member of Clubs or Organizations: Not on file  . Attends Archivist Meetings: Not on file  . Marital Status: Not on file  Intimate Partner Violence:   . Fear of Current or Ex-Partner: Not on file  . Emotionally Abused: Not on file  . Physically Abused: Not on file  . Sexually Abused: Not on file    Current Outpatient Medications on File Prior to Visit  Medication Sig Dispense Refill  . Ascorbic Acid (VITAMIN C) 1000 MG tablet Take 1,000 mg by mouth daily.    Marland Kitchen dextromethorphan-guaiFENesin (MUCINEX DM) 30-600 MG 12hr tablet Take 1 tablet by mouth 2 (two) times daily as needed for cough.    Marland Kitchen ibuprofen (ADVIL) 600 MG tablet Take 1 tablet (600 mg total) by mouth every 8 (eight) hours as needed. For pain; take after eating 60 tablet 1  . methimazole (  TAPAZOLE) 10 MG tablet Take 2 tablets (20 mg total) by mouth daily. 60 tablet 2   No current facility-administered medications on file prior to visit.    No Known Allergies  Family History  Problem Relation Age of Onset  . Healthy Mother   . Healthy Father   . Thyroid disease Neg Hx     There were no vitals taken for this visit.   Review of Systems denies sob, edema, excessive diaphoresis, tremor, anxiety, and heat intolerance. He has regained weight.     Objective:   Physical Exam   [place] (2021): Enlarged, moderately heterogeneous appearing and potentially hyperemic thyroid gland without worrisome thyroid nodule or mass.  Lab Results  Component Value Date   TSH <0.005 (L) 03/10/2019   T4TOTAL 17.4 (H) 03/10/2019   I have reviewed outside records, and summarized: Pt was noted to have abnormal  TFT, and referred here.  Shoulder pain was also addressed.       Assessment & Plan:  Hyperthyroidism, new to me.   Palpitations, improved.    Patient Instructions  Please continue the same methimazole. If ever you have fever while taking methimazole, stop it and call us, even if the reason is obvious, because of the risk of a rare side-effect. It is best to never miss the medication.  However, if you do miss it, next best is to double up the next time.   Please reduce the propranolol to 1/2 pill, twice per day. Please redo the blood tests in 1 week.  You don't have to be fasting, but please call ahead. Please come back for a follow-up appointment in 1 month, in person if possible.

## 2019-04-05 MED FILL — PROPRANOLOL 20 MG TABLET: 20 | 30 days supply | Qty: 30 | Fill #1

## 2019-04-05 MED FILL — methIMAzole 10 MG TABS: 10 | 30 days supply | Qty: 60 | Fill #1

## 2019-05-02 ENCOUNTER — Encounter (HOSPITAL_COMMUNITY): Payer: Self-pay | Admitting: Nurse Practitioner

## 2019-05-02 ENCOUNTER — Ambulatory Visit (HOSPITAL_COMMUNITY)
Admission: RE | Admit: 2019-05-02 | Discharge: 2019-05-02 | Disposition: A | Discharge status: Home / Self Care | Payer: Self-pay | Source: Ambulatory Visit | Attending: Nurse Practitioner | Admitting: Nurse Practitioner

## 2019-05-02 ENCOUNTER — Other Ambulatory Visit: Payer: Self-pay

## 2019-05-02 VITALS — BP 132/74 | HR 83 | Ht 75.0 in | Wt 218.2 lb

## 2019-05-02 DIAGNOSIS — Z7901 Long term (current) use of anticoagulants: Secondary | ICD-10-CM | POA: Insufficient documentation

## 2019-05-02 DIAGNOSIS — I480 Paroxysmal atrial fibrillation: Secondary | ICD-10-CM

## 2019-05-02 DIAGNOSIS — E0590 Thyrotoxicosis, unspecified without thyrotoxic crisis or storm: Secondary | ICD-10-CM | POA: Insufficient documentation

## 2019-05-02 DIAGNOSIS — I4891 Unspecified atrial fibrillation: Secondary | ICD-10-CM | POA: Insufficient documentation

## 2019-05-02 MED ORDER — PROPRANOLOL HCL 20 MG PO TABS: 20.0000 mg | ORAL_TABLET | Freq: Two times a day (BID) | ORAL | Status: DC

## 2019-05-02 MED FILL — PROPRANOLOL 20 MG TABLET: 20 | 15 days supply | Qty: 30 | Fill #2

## 2019-05-02 MED FILL — methIMAzole 10 MG TABS: 10 | 30 days supply | Qty: 60 | Fill #2

## 2019-05-02 NOTE — Progress Notes (Addendum)
Primary Care Physician: Cain Saupe, MD Referring Physician: [facility] ER f/u    Vincent Morse is a 44 y.o. male with a h/o ER visit 12/30 at which time afib was noted. He was also found to have hyperthyroidism and asked to f/u with  PCP. He was rate controlled. He has been feeling poorly for months with fatigue, palpitations. He was started on propanolol 20 mg bid . F/u T4, T4 free were all abnormal.  F/u in afib clinic,01/31/19. He is set to see PCP  for first time 1/15. He is in SR today. He does feel somewhat better, less palpitations.. He did start propanolol 20 mg bid from the ER. He states no alcohol use, no tobacco, caffeine use. He states he has lost a lot of weight, feels nervous a lot. He did work at [employer], but stopped working there after he started feeling bad.  No insurance. Unsure about snoring as he lives alone.   F/u in afib clinic, 05/02/19. He is in SR. He feels improved since hyperthyroidism has been treated. His endocrinologist recommended reducing propanolol to 1/2 tab bid. He tried this and felt his heart racing so he went back to 20 mg bid. He has not had any recent  afib that he appreciated.     Today, he denies symptoms of  chest pain, shortness of breath, orthopnea, PND, lower extremity edema, dizziness, presyncope, syncope, or neurologic sequela. The patient is tolerating medications without difficulties and is otherwise without complaint today.   Past Medical History:  Diagnosis Date  . Atrial fibrillation ([facility])   . Thyroid disease    Past Surgical History:  Procedure Laterality Date  . APPENDECTOMY      Current Outpatient Medications  Medication Sig Dispense Refill  . Ascorbic Acid (VITAMIN C) 1000 MG tablet Take 1,000 mg by mouth daily.    Marland Kitchen dextromethorphan-guaiFENesin (MUCINEX DM) 30-600 MG 12hr tablet Take 1 tablet by mouth 2 (two) times daily as needed for cough.    Marland Kitchen ibuprofen (ADVIL) 600 MG tablet Take 1 tablet (600 mg total) by mouth every 8 (eight)  hours as needed. For pain; take after eating 60 tablet 1  . methimazole (TAPAZOLE) 10 MG tablet Take 2 tablets (20 mg total) by mouth daily. 60 tablet 2  . propranolol (INDERAL) 20 MG tablet Take 0.5 tablets (10 mg total) by mouth 2 (two) times daily. 30 tablet 1   No current facility-administered medications for this encounter.    No Known Allergies  Social History   Socioeconomic History  . Marital status: Married    Spouse name: Not on file  . Number of children: Not on file  . Years of education: Not on file  . Highest education level: Not on file  Occupational History  . Not on file  Tobacco Use  . Smoking status: Never Smoker  . Smokeless tobacco: Never Used  Substance and Sexual Activity  . Alcohol use: No  . Drug use: No  . Sexual activity: Not Currently  Other Topics Concern  . Not on file  Social History Narrative  . Not on file   Social Determinants of Health   Financial Resource Strain:   . Difficulty of Paying Living Expenses:   Food Insecurity:   . Worried About Programme researcher, broadcasting/film/video in the Last Year:   . Barista in the Last Year:   Transportation Needs:   . Freight forwarder (Medical):   Marland Kitchen Lack of Transportation (Non-Medical):  Physical Activity:   . Days of Exercise per Week:   . Minutes of Exercise per Session:   Stress:   . Feeling of Stress :   Social Connections:   . Frequency of Communication with Friends and Family:   . Frequency of Social Gatherings with Friends and Family:   . Attends Religious Services:   . Active Member of Clubs or Organizations:   . Attends Archivist Meetings:   Marland Kitchen Marital Status:   Intimate Partner Violence:   . Fear of Current or Ex-Partner:   . Emotionally Abused:   Marland Kitchen Physically Abused:   . Sexually Abused:     Family History  Problem Relation Age of Onset  . Healthy Mother   . Healthy Father   . Thyroid disease Neg Hx     ROS- All systems are reviewed and negative except as per the  HPI above  Physical Exam: There were no vitals filed for this visit. Wt Readings from Last 3 Encounters:  03/10/19 205 lb (93 kg)  02/26/19 212 lb (96.2 kg)  02/24/19 212 lb (96.2 kg)    Labs: Lab Results  Component Value Date   NA 137 02/26/2019   K 3.9 02/26/2019   CL 104 02/26/2019   CO2 23 02/26/2019   GLUCOSE 104 (H) 02/26/2019   BUN 11 02/26/2019   CREATININE 0.62 02/26/2019   CALCIUM 9.5 02/26/2019   No results found for: INR No results found for: CHOL, HDL, LDLCALC, TRIG   GEN- The patient is well appearing, alert and oriented x 3 today.   Head- normocephalic, atraumatic Eyes-  Sclera clear, conjunctiva pink Ears- hearing intact Oropharynx- clear Neck- supple, no JVP Lymph- no cervical lymphadenopathy Lungs- Clear to ausculation bilaterally, normal work of breathing Heart- Regular rate and rhythm, no murmurs, rubs or gallops, PMI not laterally displaced GI- soft, NT, ND, + BS Extremities- no clubbing, cyanosis, or edema MS- no significant deformity or atrophy Skin- no rash or lesion Psych- euthymic mood, full affect Neuro- strength and sensation are intact  EKG-NSR at 83 bpm, pr int 174 bpm, qrs int 96 ms, qtc 418    Assessment and Plan: 1. New onset afib in the setting of newly dx hyperthyroidism Has been maintaining  SR since tx for thyroid has been started  General education re afib   Continue propanolol 20 mg bid ( pt tried to reduce to 10 mg bid, felt his heart race on this dose) Echo scheduled today  2. Hyperthyroidism  F/u with PCP/endocriniology   3. CHA2DS2VASc score of 0  No anticoagulation is needed at this time   I will phone pt report and can see as needed if afib returns   Social worker is working with pt  regarding financial assistance   Butch Penny C. Ruchel Brandenburger, [facility] [address] [place], [place] [address] [phone]

## 2019-05-04 ENCOUNTER — Ambulatory Visit (HOSPITAL_COMMUNITY): Admission: RE | Admit: 2019-05-04 | Payer: Self-pay | Source: Ambulatory Visit

## 2019-06-22 MED FILL — PROPRANOLOL 20 MG TABLET: 20 | 15 days supply | Qty: 30 | Fill #4

## 2019-07-11 ENCOUNTER — Other Ambulatory Visit: Payer: Self-pay | Admitting: Family Medicine

## 2019-07-11 DIAGNOSIS — M25511 Pain in right shoulder: Secondary | ICD-10-CM

## 2019-07-11 DIAGNOSIS — G8929 Other chronic pain: Secondary | ICD-10-CM

## 2019-07-11 MED FILL — PROPRANOLOL 20 MG TABLET: 20 | 15 days supply | Qty: 30 | Fill #4

## 2019-07-11 MED FILL — methIMAzole 10 MG TABS: 10 | 30 days supply | Qty: 60 | Fill #1

## 2019-08-08 ENCOUNTER — Telehealth: Payer: Self-pay

## 2019-08-08 ENCOUNTER — Other Ambulatory Visit: Payer: Self-pay | Admitting: Family Medicine

## 2019-08-08 ENCOUNTER — Other Ambulatory Visit: Payer: Self-pay | Admitting: Internal Medicine

## 2019-08-08 DIAGNOSIS — G8929 Other chronic pain: Secondary | ICD-10-CM

## 2019-08-08 MED ORDER — IBUPROFEN 600 MG PO TABS
600.0000 mg | ORAL_TABLET | Freq: Three times a day (TID) | ORAL | 0 refills | Status: AC | PRN
Start: 2019-08-08 — End: ?

## 2019-08-08 MED FILL — PROPRANOLOL 20 MG TABLET: 20 | 15 days supply | Qty: 30 | Fill #5

## 2019-08-08 MED FILL — methIMAzole 10 MG TABS: 10 | 30 days supply | Qty: 60 | Fill #2

## 2019-08-08 MED FILL — IBUPROFEN 600 MG TABLET: 600 | 10 days supply | Qty: 30 | Fill #0

## 2019-08-08 NOTE — Telephone Encounter (Signed)
Requested medication (s) are due for refill today - unknown  Requested medication (s) are on the active medication list -no  Future visit scheduled -no  Last refill: 06/06/19  Notes to clinic: Patient requesting RF of medication that is not current on medication list. Sent for review   Requested Prescriptions  Pending Prescriptions Disp Refills   ibuprofen (ADVIL) 600 MG tablet [Pharmacy Med Name: IBUPROFEN 600 MG TABLET 600 Tablet] 60 tablet 1    Sig: Take 1 tablet (600 mg total) by mouth every 8 (eight) hours as needed. For pain; take after eating      Analgesics:  NSAIDS Failed - 08/08/2019 11:45 AM      Failed - HGB in normal range and within 360 days    Hemoglobin  Date Value Ref Range Status  02/26/2019 11.5 (L) 13.0 - 17.0 g/dL Final  37/16/9678 93.8 (L) 13.0 - 17.7 g/dL Final          Passed - Cr in normal range and within 360 days    Creatinine, Ser  Date Value Ref Range Status  02/26/2019 0.62 0.61 - 1.24 mg/dL Final          Passed - Patient is not pregnant      Passed - Valid encounter within last 12 months    Recent Outpatient Visits           5 months ago Hyperthyroidism   [facility] [place], [place], MD   5 months ago Palpitations   [facility] [place], [place], MD                  Requested Prescriptions  Pending Prescriptions Disp Refills   ibuprofen (ADVIL) 600 MG tablet [Pharmacy Med Name: IBUPROFEN 600 MG TABLET 600 Tablet] 60 tablet 1    Sig: Take 1 tablet (600 mg total) by mouth every 8 (eight) hours as needed. For pain; take after eating      Analgesics:  NSAIDS Failed - 08/08/2019 11:45 AM      Failed - HGB in normal range and within 360 days    Hemoglobin  Date Value Ref Range Status  02/26/2019 11.5 (L) 13.0 - 17.0 g/dL Final  11/12/5100 58.5 (L) 13.0 - 17.7 g/dL Final          Passed - Cr in normal range and within 360 days    Creatinine, Ser  Date Value Ref Range Status   02/26/2019 0.62 0.61 - 1.24 mg/dL Final          Passed - Patient is not pregnant      Passed - Valid encounter within last 12 months    Recent Outpatient Visits           5 months ago Hyperthyroidism   [facility] [place], [place], MD   5 months ago Palpitations   [facility] [place], [place], MD

## 2019-08-08 NOTE — Telephone Encounter (Signed)
I sent in ibuprofen. If still having shoulder pain would need to see [facility] provider for further assessment.   Marcy Siren, D.O. [facility] at [facility]  08/08/2019, 1:33 PM

## 2019-08-08 NOTE — Telephone Encounter (Signed)
Pt request ibuprofen 600mg  to [facility] pharmacy. Pt saw Fulp for shoulder pain 03/10/19. Pt ph [phone]

## 2019-09-15 ENCOUNTER — Other Ambulatory Visit: Payer: Self-pay | Admitting: Family Medicine

## 2019-09-15 DIAGNOSIS — E0590 Thyrotoxicosis, unspecified without thyrotoxic crisis or storm: Secondary | ICD-10-CM

## 2019-09-15 NOTE — Telephone Encounter (Signed)
Requested medication (s) are due for refill today: yes  Requested medication (s) are on the active medication list: yes   Last refill: 08/08/2019  Future visit scheduled:no  Notes to clinic:  this refill cannot be delegated    Requested Prescriptions  Pending Prescriptions Disp Refills   methimazole (TAPAZOLE) 10 MG tablet [Pharmacy Med Name: methIMAzole 10 MG TABS 10 Tablet] 60 tablet 2    Sig: Take 2 tablets (20 mg total) by mouth daily.      Not Delegated - Endocrinology:  Hyperthyroid Agents Failed - 09/15/2019  1:22 PM      Failed - This refill cannot be delegated      Failed - TSH in normal range and within 180 days    TSH  Date Value Ref Range Status  03/10/2019 <0.005 (L) 0.450 - 4.500 uIU/mL Final          Failed - Valid encounter within last 6 months    Recent Outpatient Visits           6 months ago Hyperthyroidism   [facility] [place], [place], MD   6 months ago Palpitations   [facility] [place], [place], MD                propranolol (INDERAL) 20 MG tablet [Pharmacy Med Name: PROPRANOLOL 20 MG TABLET 20 Tablet] 30 tablet 2    Sig: Take 1 tablet (20 mg total) by mouth 2 (two) times daily.      Cardiovascular:  Beta Blockers Failed - 09/15/2019  1:22 PM      Failed - Valid encounter within last 6 months    Recent Outpatient Visits           6 months ago Hyperthyroidism   [facility] [place], [place], MD   6 months ago Palpitations   [facility] [place], [place], MD              Passed - Last BP in normal range    BP Readings from Last 1 Encounters:  05/02/19 132/74          Passed - Last Heart Rate in normal range    Pulse Readings from Last 1 Encounters:  05/02/19 83

## 2019-09-16 MED FILL — methIMAzole 10 MG TABS: 10 | 30 days supply | Qty: 60 | Fill #0

## 2019-09-16 MED FILL — PROPRANOLOL 20 MG TABLET: 20 | 30 days supply | Qty: 60 | Fill #0

## 2019-11-14 ENCOUNTER — Other Ambulatory Visit: Payer: Self-pay | Admitting: Family Medicine

## 2019-11-14 DIAGNOSIS — E0590 Thyrotoxicosis, unspecified without thyrotoxic crisis or storm: Secondary | ICD-10-CM

## 2019-11-14 NOTE — Telephone Encounter (Signed)
Requested medication (s) are due for refill today -yes  Requested medication (s) are on the active medication list -yes  Future visit scheduled -no  Last refill: 09/28/19  Notes to clinic: Request RF of non delegated rx- courtesy RF already given  Requested Prescriptions  Pending Prescriptions Disp Refills   methimazole (TAPAZOLE) 10 MG tablet [Pharmacy Med Name: METHIMAZOLE 10 MG TABLET] 60 tablet 0    Sig: take 2 tablets by mouth once daily *NEED APPOINTMENT      Not Delegated - Endocrinology:  Hyperthyroid Agents Failed - 11/14/2019  2:47 PM      Failed - This refill cannot be delegated      Failed - TSH in normal range and within 180 days    TSH  Date Value Ref Range Status  03/10/2019 <0.005 (L) 0.450 - 4.500 uIU/mL Final          Failed - Valid encounter within last 6 months    Recent Outpatient Visits           8 months ago Hyperthyroidism   [facility] [place], [place], [place]   8 months ago Palpitations   [facility] [place], [place], [place]                  Requested Prescriptions  Pending Prescriptions Disp Refills   methimazole (TAPAZOLE) 10 MG tablet [Pharmacy Med Name: METHIMAZOLE 10 MG TABLET] 60 tablet 0    Sig: take 2 tablets by mouth once daily *NEED APPOINTMENT      Not Delegated - Endocrinology:  Hyperthyroid Agents Failed - 11/14/2019  2:47 PM      Failed - This refill cannot be delegated      Failed - TSH in normal range and within 180 days    TSH  Date Value Ref Range Status  03/10/2019 <0.005 (L) 0.450 - 4.500 uIU/mL Final          Failed - Valid encounter within last 6 months    Recent Outpatient Visits           8 months ago Hyperthyroidism   [facility] Fulp, [place], MD   8 months ago Palpitations   [facility] [place], [place], MD

## 2019-12-16 ENCOUNTER — Ambulatory Visit: Attending: Family Medicine | Primary: Family Medicine

## 2019-12-16 ENCOUNTER — Ambulatory Visit: Admit: 2019-12-16 | Discharge: 2019-12-16 | Attending: Family Medicine | Primary: Family Medicine

## 2019-12-16 DIAGNOSIS — E0590 Thyrotoxicosis, unspecified without thyrotoxic crisis or storm: Secondary | ICD-10-CM

## 2019-12-16 MED ORDER — IBUPROFEN 600 MG TAB
600 mg | ORAL_TABLET | Freq: Three times a day (TID) | ORAL | 1 refills | Status: AC | PRN
Start: 2019-12-16 — End: 2019-12-26

## 2019-12-16 MED ORDER — METHIMAZOLE 10 MG TAB
10 mg | ORAL_TABLET | Freq: Every day | ORAL | 2 refills | Status: DC
Start: 2019-12-16 — End: 2020-02-05

## 2019-12-16 NOTE — Progress Notes (Signed)
HPI    Robert Harrison is a 44 y.o. male and presents today for New Patient, Thyroid Problem, and Medication Refill (tapazole)  .  HPI     44 Yo AM with a hx of Hyperthyroidism diagnosed over a year ago and currently out Mithimazole and Obesity c/o generalized bodyches    Allergies    No Known Allergies     Medications    Current Outpatient Medications   Medication Sig Dispense   ??? IBUPROFEN PO Take  by mouth as needed.    ??? methIMAzole (TAPAZOLE) 10 mg tablet Take 1 Tablet by mouth daily for 30 days. 30 Tablet     No current facility-administered medications for this visit.        Health Maintenance    Health Maintenance Due   Topic Date Due   ??? Hepatitis C Screening  Never done   ??? COVID-19 Vaccine (1) Never done   ??? DTaP/Tdap/Td series (1 - Tdap) Never done   ??? Lipid Screen  Never done   ??? Flu Vaccine (1) Never done        Problem List    Patient Active Problem List    Diagnosis Date Noted   ??? Hyperthyroidism 12/16/2019   ??? Class 1 obesity due to excess calories with serious comorbidity and body mass index (BMI) of 30.0 to 30.9 in adult 12/16/2019   ??? Thyroid condition         Family Hx    History reviewed. No pertinent family history.     Social Hx    Social History     Socioeconomic History   ??? Marital status: MARRIED   Tobacco Use   ??? Smoking status: Never Smoker   ??? Smokeless tobacco: Never Used        Surgical Hx    History reviewed. No pertinent surgical history.       Vitals    Visit Vitals  BP 110/60 (BP 1 Location: Right upper arm, BP Patient Position: Sitting)   Pulse 100   Temp 97.8 ??F (36.6 ??C) (Temporal)   Resp 14   Ht 6\' 3"  (1.905 m)   Wt 245 lb (111.1 kg)   SpO2 97%   BMI 30.62 kg/m??        ROS    ROS      Physical Exam      Physical Exam     Assessment/Plan    Diagnoses and all orders for this visit:    1. Hyperthyroidism  -     T4 (THYROXINE)  -     TSH 3RD GENERATION    2. Class 1 obesity due to excess calories with serious comorbidity and body mass index (BMI) of 30.0 to 30.9 in adult  -     T4  (THYROXINE)  -     TSH 3RD GENERATION  -     METABOLIC PANEL, COMPREHENSIVE    Other orders  -     methIMAzole (TAPAZOLE) 10 mg tablet; Take 1 Tablet by mouth daily for 30 days.         Health Maintenance Items reviewed with patient as noted.

## 2019-12-16 NOTE — Progress Notes (Signed)
 Robert Harrison is a 44 y.o. male    Chief Complaint   Patient presents with   . New Patient   . Thyroid Problem   . Medication Refill     tapazole       Health Maintenance Due   Topic Date Due   . Hepatitis C Screening  Never done   . COVID-19 Vaccine (1) Never done   . DTaP/Tdap/Td series (1 - Tdap) Never done   . Lipid Screen  Never done   . Flu Vaccine (1) Never done       Visit Vitals  BP 110/60 (BP 1 Location: Right upper arm, BP Patient Position: Sitting)   Pulse 100   Temp 97.8 F (36.6 C) (Temporal)   Resp 14   Ht 6' 3 (1.905 m)   Wt 245 lb (111.1 kg)   SpO2 97%   BMI 30.62 kg/m       3 most recent PHQ Screens 12/16/2019   Little interest or pleasure in doing things More than half the days   Feeling down, depressed, irritable, or hopeless More than half the days   Total Score PHQ 2 4       No flowsheet data found.    Abuse Screening Questionnaire 12/16/2019   Do you ever feel afraid of your partner? N   Are you in a relationship with someone who physically or mentally threatens you? N   Is it safe for you to go home? Y         1. Have you been to the ER, urgent care clinic since your last visit?  Hospitalized since your last visit?no    2. Have you seen or consulted any other health care providers outside of the [facility] since your last visit?  Include any pap smears or colon screening.no

## 2020-01-13 NOTE — Telephone Encounter (Signed)
Patient called and said he is suppose to be taking the methimazole 2 x a day instead of 1 and the prescription was sent for once a day.  Please clarify as to the correct dosege and a new prescription will need to be sent if it is 2 x a day

## 2020-01-16 ENCOUNTER — Encounter: Attending: Family Medicine | Primary: Family Medicine

## 2020-02-05 MED ORDER — METHIMAZOLE 10 MG TAB
10 mg | ORAL_TABLET | ORAL | 2 refills | Status: DC
Start: 2020-02-05 — End: 2020-02-09

## 2020-02-09 MED ORDER — METHIMAZOLE 10 MG TAB
10 mg | ORAL_TABLET | Freq: Every day | ORAL | 2 refills | Status: AC
Start: 2020-02-09 — End: ?

## 2020-02-09 NOTE — Telephone Encounter (Signed)
Patient came into the office and wants you to send his medication Methlmazole to [facility].

## 2020-03-16 ENCOUNTER — Encounter: Attending: Family Medicine | Primary: Family Medicine

## 2020-04-13 ENCOUNTER — Encounter: Attending: Family Medicine | Primary: Family Medicine

## 2021-01-24 ENCOUNTER — Emergency Department: Admit: 2021-01-24 | Primary: Family Medicine

## 2021-01-24 ENCOUNTER — Inpatient Hospital Stay
Admit: 2021-01-24 | Discharge: 2021-01-24 | Disposition: A | Discharge status: Home / Self Care | Attending: Emergency Medicine

## 2021-01-24 DIAGNOSIS — R059 Cough, unspecified: Secondary | ICD-10-CM

## 2021-01-24 MED ORDER — ALBUTEROL SULF 90 MCG/ACTUATION BREATH ACTIVATED POWDER INHALER,SENSOR
90 mcg/actuation | RESPIRATORY_TRACT | 0 refills | Status: AC | PRN
Start: 2021-01-24 — End: ?

## 2021-01-24 MED ORDER — ALBUTEROL SULFATE HFA 90 MCG/ACTUATION AEROSOL INHALER
90 mcg/actuation | Freq: Once | RESPIRATORY_TRACT | Status: AC
Start: 2021-01-24 — End: 2021-01-24
  Administered 2021-01-24: 12:00:00 via RESPIRATORY_TRACT

## 2021-01-24 MED FILL — ALBUTEROL SULFATE HFA 90 MCG/ACTUATION AEROSOL INHALER: 90 mcg/actuation | RESPIRATORY_TRACT | Qty: 6.7

## 2021-01-24 NOTE — ED Notes (Signed)
Patient complains of a fever off and on for the last month and a cough that began Saturday.

## 2021-01-24 NOTE — ED Provider Notes (Signed)
ED Provider Notes by Mariane Duval, MD at 01/24/21 1308                Author: Mariane Duval, MD  Service: Emergency Medicine  Author Type: Physician       Filed: 01/24/21 0700  Date of Service: 01/24/21 0615  Status: Signed          Editor: Mariane Duval, MD (Physician)               [name]   EMERGENCY DEPARTMENT ENCOUNTER NOTE              Date: 01/24/2021   Patient Name: Robert Harrison           History of Presenting Illness          Chief Complaint       Patient presents with        ?  Fever        ?  Cough           History Provided By: Patient      HPI: 27 Robert Harrison, 45 y.o. male with PMH of thyroid disorder presents to the ED with intermittent fever, coughing, and shortness of breath.   He reports symptoms been intermittently going over the last month.  Has not seen a doctor to get evaluated.  He denies any chest pain, abdominal pain, nausea, vomiting, or pain in any other parts of his body.  He is taking only methimazole for thyroid  disorder and is not taking any other medications.  He is denying any recent travel.  No exposure to someone with chronic illnesses.  No exposure to tuberculosis.  His symptoms are of mild to moderate severity, no known aggravating relieving factors without  associate additional symptoms.         PCP: Robert Santiago, MD        Current Outpatient Medications          Medication  Sig  Dispense  Refill           ?  albuterol sulfate 90 mcg/actuation aebs  Take 2 Puffs by inhalation every four to six (4-6) hours as needed for Wheezing or Cough.  1 Each  0     ?  methIMAzole (TAPAZOLE) 10 mg tablet  Take 1 Tablet by mouth daily.  30 Tablet  2           ?  IBUPROFEN PO  Take  by mouth as needed.                 Past History        Past Medical History:     Past Medical History:        Diagnosis  Date         ?  Thyroid condition             Past Surgical History:   No past surgical history on file.      Family History:   No  family history on file.      Social History:     Social History          Tobacco Use         ?  Smoking status:  Never     ?  Smokeless tobacco:  Never       Substance Use Topics         ?  Alcohol use:  Not Currently         ?  Drug use:  Never           Allergies:   No Known Allergies           Review of Systems        Review of Systems      A 10 point review of system was performed and was negative except as noted above in HPI        Physical Exam        Physical Exam   Vitals and nursing note reviewed.    Constitutional:        General: He is not in acute distress.      Appearance: He is not ill-appearing, toxic-appearing or diaphoretic.    HENT:       Head: Normocephalic and atraumatic.    Cardiovascular:       Rate and Rhythm: Normal rate and regular rhythm.       Heart sounds: Normal heart sounds.    Pulmonary:       Effort: Pulmonary effort is normal.       Breath sounds: Normal breath sounds.     Abdominal:       Palpations: Abdomen is soft.       Tenderness: There is no abdominal tenderness.     Musculoskeletal:       Cervical back: Normal range of motion and neck supple.       Right lower leg: No tenderness. No edema.       Left lower leg: No tenderness. No edema.    Skin:      General: Skin is warm and dry.    Neurological:       Mental Status: He is alert and oriented to person, place, and time.            Lab and Diagnostic Study Results        Labs -    No results found for this or any previous visit (from the past 12 hour(s)).      Radiologic Studies -    @LASTXRRESULT @     CT Results  (Last 48 hours)             None                    CXR Results  (Last 48 hours)                                       01/24/21 0614    XR CHEST PORT  Final result            Impression:    No acute process identified.                               Narrative:    Clinical history: cough      INDICATION:   cough      COMPARISON: None             FINDINGS:      AP portable upright view of the chest demonstrates a stable   cardiopericardial      silhouette.There is no pleural effusion..There is no focal consolidation..There      is no pneumothorax.01/26/21  Medical Decision Making and ED Course     - I am the first and primary provider for this patient AND AM THE PRIMARY PROVIDER OF RECORD.      - I reviewed the vital signs, available nursing notes, past medical history, past surgical history, family history and social history.      - Initial assessment performed. The patients presenting problems have been discussed, and the staff are in agreement with the care plan formulated and outlined with them.  I have encouraged them to ask questions as they arise throughout their visit.      Vital Signs-Reviewed the patient's vital signs.      Patient Vitals for the past 24 hrs:            Temp  Pulse  Resp  BP  SpO2            01/24/21 0654  --  --  --  131/87  --            01/24/21 0640  --  --  --  --  100 %            01/24/21 0526  98.4 ??F (36.9 ??C)  80  17  (!) 164/108  100 %           Records Reviewed: Nursing Notes      Provider Notes (Medical Decision Making):       Patient is 45 year old male presents to the ED with what he reports is intermittent fever, coughing, and shortness of breath has been going on for a month.  He is denying URI-like symptoms.  He did not measure his temperature at home but reports subjective  fever.  In the ED, patient is hemodynamically stable.  His lung examination is unremarkable.  No lower extremity swelling that may signify cardiac disease.  He is currently taking medications for thyroid disorder.  He has not followed up with his primary  care doctor regarding this issue.  He got 1 dose of albuterol in the ED.  Has some improvement.  Thus, will discharge him with albuterol inhaler to follow-up with PMD.        Diagnosis        Clinical Impression:       1.  Cough, unspecified type         2.  Elevated blood pressure reading           Disposition        Disposition:  Condition improved   DC- Adult Discharges: All of the diagnostic tests were reviewed and questions answered. Diagnosis, care plan and treatment options were discussed.  The patient understands the instructions and will follow up as directed. The patients results have been  reviewed with them.  They have been counseled regarding their diagnosis.  The patient verbally convey understanding and agreement of the signs, symptoms, diagnosis, treatment and prognosis and additionally agrees to follow up as recommended with their  PCP in 24 - 48 hours.  They also agree with the care-plan and convey that all of their questions have been answered.  I have also put together some discharge instructions for them that include: 1) educational information regarding their diagnosis, 2)  how to care for their diagnosis at home, as well a 3) list of reasons why they would want to return to the ED prior to their follow-up appointment, should their condition change.      Discharged  DISCHARGE PLAN:   1.      Follow-up Information                  Follow up With  Specialties  Details  Why  Contact Info              [facility] EMERGENCY DEPT  Emergency Medicine  Go to   As needed, If symptoms worsen  [address]   [place] [address]   [phone]              Robert Santiago, MD  Family Medicine  Schedule an appointment as soon as possible for a visit on 01/28/2021  For reevaluation, Discuss your visit to the ER  [address] [facility]   [place] [address]   [phone]                     2.  Return to ED if worse    3.      Current Discharge Medication List                 START taking these medications          Details        albuterol sulfate 90 mcg/actuation aebs  Take 2 Puffs by inhalation every four to six (4-6) hours as needed for Wheezing or Cough.   Qty: 1 Each, Refills: 0   Start date: 01/24/2021                            Attestations:      Karlye Ihrig Donzetta Sprung, MD      Please note that this dictation was  completed with Dragon, the computer voice recognition software.  Quite often unanticipated grammatical, syntax, homophones, and other interpretive errors are inadvertently  transcribed by the computer software.  Please disregard these errors.  Please excuse any errors that have escaped final proofreading.  Thank you.

## 2021-08-19 ENCOUNTER — Emergency Department: Admit: 2021-08-19 | Primary: Family Medicine

## 2021-08-19 ENCOUNTER — Inpatient Hospital Stay
Admit: 2021-08-19 | Discharge: 2021-08-19 | Disposition: A | Discharge status: Home / Self Care | Attending: Sports Medicine

## 2021-08-19 DIAGNOSIS — M545 Low back pain, unspecified: Secondary | ICD-10-CM

## 2021-08-19 MED ORDER — LIDOCAINE 4 % EX PTCH
4 % | CUTANEOUS | Status: DC
Start: 2021-08-19 — End: 2021-08-19
  Administered 2021-08-19: 20:00:00 1 via TRANSDERMAL

## 2021-08-19 MED ORDER — KETOROLAC TROMETHAMINE 15 MG/ML IJ SOLN
15 MG/ML | Freq: Once | INTRAMUSCULAR | Status: AC
Start: 2021-08-19 — End: 2021-08-19
  Administered 2021-08-19: 20:00:00 15 mg via INTRAMUSCULAR

## 2021-08-19 NOTE — Discharge Instructions (Signed)
Thank you!    Thank you for allowing me to care for you in the emergency department.  I sincerely hope that you are satisfied with your visit today.  It is my goal to provide you with excellent care.    Below you will find a list of your labs and imaging from your visit today if applicable. Should you have any questions regarding these results please do not hesitate to call the emergency department. Please review MyChart for a more detailed result list since the below list may not be comprehensive. Instructions on how to sign up to MyChart should be provided in this packet.    Labs -   No results found for this or any previous visit (from the past 12 hour(s)).    Radiologic Studies -   XR CHEST (2 VW)   Final Result   No acute cardiopulmonary disease.           @CT48@  @CXR48@       If you feel that you have not received excellent quality care or timely care, please ask to speak to the nurse manager. Please choose us in the future for your continued health care needs.   ------------------------------------------------------------------------------------------------------------  The exam and treatment you received in the Emergency Department were for an urgent problem and are not intended as complete care. It is very important that you follow-up with a doctor, nurse practitioner, or physician assistant in a timely manner to:  (1) confirm your diagnosis and review all imaging and lab results,  (2) re-evaluation of changes in your illness and treatment, and  (3) for ongoing care.  If your symptoms become worse or you do not improve as expected and you are unable to reach your usual health care provider, you should return to the Emergency Department. We are available 24 hours a day.     Please take your discharge instructions with you when you go to your follow-up appointment.     If a prescription has been provided, please have it filled as soon as possible to prevent a delay in treatment. Read the entire medication  instruction sheet provided to you by the pharmacy. If you have any questions or reservations about taking the medication due to side effects or interactions with other medications, please call your primary care physician or contact the ER to speak with the charge nurse.     Make an appointment with your family doctor or the physician you were referred to for follow-up of this visit as instructed on your discharge paperwork, as this is a mandatory follow-up. Return to the ER if you are unable to be seen or if you are unable to be seen in a timely manner.    If you have any problem arranging the follow-up visit, contact the Emergency Department immediately.

## 2021-08-19 NOTE — ED Provider Notes (Signed)
[name]  EMERGENCY DEPARTMENT ENCOUNTER NOTE    Date: 08/19/2021  Patient Name: Robert Harrison [id number]    History of Presenting Illness     Chief Complaint   Patient presents with    Back Pain       History obtained from: Patient    HPI: Robert Harrison, 46 y.o. male with past medical history as listed and reviewed below presenting for back pain.    The patient presents with low back pain that is described as cramping in the left upper back, he noted as developed pain when he was sitting down yesterday, he notes it is not reproduced with any palpation, it is reproduced when he walks or sits.    - Prior episodes: no  - Weakness or numbness in upper or lower extremities: no  - Trauma: no  - Urine incontinence or retention: no  - Dysuria or hematuria: no  - Active malignancy: no  - IV drug use: no  - Fever or current infectious symptoms: no  - Immunosuppression: no  - Recent Spinal Procedure: no  - Medications taken for pain prior to arrival: no    No other acute complaints.    Medical History   I reviewed the medical, surgical, family, and social history, as well as allergies:    PCP: Lawrence Santiago, MD    Past Medical History:  Past Medical History:   Diagnosis Date    Thyroid condition      Past Surgical History:  No past surgical history on file.  Current Outpatient Medications:  Current Outpatient Medications   Medication Instructions    Albuterol Sulfate, sensor, 108 (90 Base) MCG/ACT AEPB 2 puffs, Inhalation    IBUPROFEN PO Oral, PRN    methIMAzole (TAPAZOLE) 10 mg, Oral, DAILY      Family History:  No family history on file.  Social History:  Social History     Tobacco Use    Smoking status: Never    Smokeless tobacco: Never   Substance Use Topics    Alcohol use: Not Currently    Drug use: Never     Allergies:  No Known Allergies    Review of Systems     Positives and pertinent negatives as per HPI, otherwise negative ROS.    Physical Exam and Vital Signs   Vital Signs -  Reviewed the patient's vital signs.    Vitals:    08/19/21 1427   BP: 135/84   Pulse: 68   Resp: 18   Temp: 98.2 F (36.8 C)   SpO2: 100%       Physical Exam:    GENERAL: awake, alert, cooperative, not in distress  HEENT:  * Pupils equal, EOMI  * Head atraumatic  CV:  * audible heart sounds  * warm and perfused extremities bilaterally  PULMONARY: Good air movement, no wheezes, no crackles  ABDOMEN/GU: soft, no distension, no guarding, no abdominal tenderness  BACK  * No obvious trauma to the back, no ecchymosis, scoliosis, lacerations, or abrasions  * No midline back tenderness  * No paravertebral back tenderness  * No step-offs or deformities  * Normal power and sensation in bilateral lower extremities  * Normal and equal DP/PT pulses in bilateral lower extremities  * Negative straight leg raise test  * Normal bilateral hip and knee ROM. No hip tenderness.  No CVA tenderness to palpation.  SKIN: no rashes or signs of trauma  NEURO:  *  Speech clear  * Moves U&LE to command    Medical Decision Making     Patient is a 46 y.o. male presenting for back pain. Vitals reveal no significant abnormalities and physical exam reveals no significant abnormalities. Based on the history, physical exam, risk factors, and vital signs, I favor the following differential diagnoses: muscle strain, disk disease, arthritis, sprain, muscle spasm.    I doubt cauda equina syndrome due to lack of neurologic findings or red flags. I doubt UTI or pyelonephritis due to absence of urinary symptoms. I doubt aortic pathology due to stable vitals, no red flags on history, and reassuring exam. I doubt ureterolithiasis due to non-suggestive history and examination.    Will provider symptomatic treatment in the ED. obtain chest x-ray given he states it is in the left upper back, will be sure lungs look clear.  See ED Course for further evaluation and discussion.    Records Reviewed: Nursing Notes  Social Determinants of health affecting management:  None    ED Course and Reassessment     ED Course:      Without any acute process, no abnormal mediastinal widening, score for thoracic aortic dissection screening 0, we will have the patient follow-up with orthopedics    I utilized an evidence-based risk rating tool (CMT) along with my training and experience to weigh the risk of discharge against the risks of further testing, imaging, or hospitalization. At this time I estimate the risks of additional testing, imaging, or hospitalization to be equal to or greater than the risk of discharge. I discussed my risk assessment with the patient and the patient consents to the risk of discharge as well as the risk of uncertainty in estimating outcomes.  [id number]       Reassessment:    The patient presents with low back pain without evidence for a more malignant injury or process. Evaluation in the ED was negative.    The patient had improvement of the symptoms with the interventions in the emergency department and was able to ambulate with improvement of the discomfort.    After ED evaluation, and due to the absence of findings that indicate neurologic deficits or alarming physical exam or historical features, the patient is a good candidate for outpatient symptomatic management.    It was explained that back pain of this nature will take time to improve. Instructions were given that the patient returns to the emergency department or call primary physician with any worsening symptoms including but not limited to: numbness or weakness in the legs, bowel or bladder problems, numbness in the groin. Instructed were also given to return or call with any worsening symptomatology or questions. Routine discharge counseling was given including the fact that any worsening, changing or persistent symptoms should prompt an immediate call or follow up with their primary physician or the emergency department. The importance of appropriate follow up was also discussed. More  extensive discharge instructions were given in the patient's discharge paperwork.    Understanding was insured that at this time there is no evidence for a more malignant underlying process, but that early in the process of an illness, an emergency department workup can be falsely reassuring.     After completion of evaluation and discussion of results and diagnoses, all the questions were answered. If required, all follow up appointments and treatments were discussed and explained. Understanding was insured prior to discharge.      Diagnosis     Clinical Impression:  1. Acute left-sided low back pain without sciatica        Final Disposition     DISPOSITION: DISCHARGED    Patient will be discharged from the Emergency Department in stable condition. All of the diagnostic tests were reviewed and any questions were answered. Diagnosis, results, follow up if applicable, and return precautions were discussed. I have also put together printed discharge instructions for them that include: 1) educational information regarding their diagnosis, 2) how to care for their diagnosis at home, as well a 3) list of reasons why they would want to return to the ED prior to their follow-up appointment, should their condition change. Any labs or imaging done in the ED will be either printed with the discharge paperwork or available through MyChart.    DISCHARGE PLAN:  1.   Current Discharge Medication List        CONTINUE these medications which have NOT CHANGED    Details   IBUPROFEN PO Take by mouth as needed      Albuterol Sulfate, sensor, 108 (90 Base) MCG/ACT AEPB Inhale 2 puffs into the lungs      methIMAzole (TAPAZOLE) 10 MG tablet Take 1 tablet by mouth daily            2. [facility]  [phone]  Call in 1 [place]  [address]  [place] [address]  [phone]    Call today      3.  Return to ED if worse    4.      Medication List        ASK your doctor about these medications       Albuterol Sulfate (sensor) 108 (90 Base) MCG/ACT Aepb     IBUPROFEN PO     methIMAzole 10 MG tablet  Commonly known as: TAPAZOLE               Results, Consults, Medications     Consults:  None   Labs:  No results found for this or any previous visit (from the past 12 hour(s)).  Radiologic Studies:  XR CHEST (2 VW)   Final Result   No acute cardiopulmonary disease.           Medications ordered:  Medications   lidocaine 4 % external patch 1 patch (1 patch TransDERmal Patch Applied 08/19/21 1544)   ketorolac (TORADOL) injection 15 mg (15 mg IntraMUSCular Given 08/19/21 1544)       Documentation Comments   - I am the first and primary provider for this patient and am the primary provider of record.  - Initial assessment performed. The patients presenting problems have been discussed, and the staff are in agreement with the care plan formulated and outlined with them.  I have encouraged them to ask questions as they arise throughout their visit.  - Available medical records, nursing notes, old EKGs, and EMS run sheets (if patient was EMS transported) were reviewed    Please note that this dictation was completed with Dragon, the computer voice recognition software.  Quite often unanticipated grammatical, syntax, homophones, and other interpretive errors are inadvertently transcribed by the computer software.  Please disregard these errors.  Please excuse any errors that have escaped final proofreading.       Trish Mage, MD  08/19/21 1710

## 2021-08-19 NOTE — ED Triage Notes (Signed)
Patient complains of back pain x 2 days

## 2021-08-31 IMAGING — CR DG CHEST 2V
2 series · 2 of 2 positions shown · non-contrast
Comparison: Chest radiograph 12/15/2018

CLINICAL DATA: Palpitations.

EXAM:
CHEST - 2 VIEW

[w chest pa]
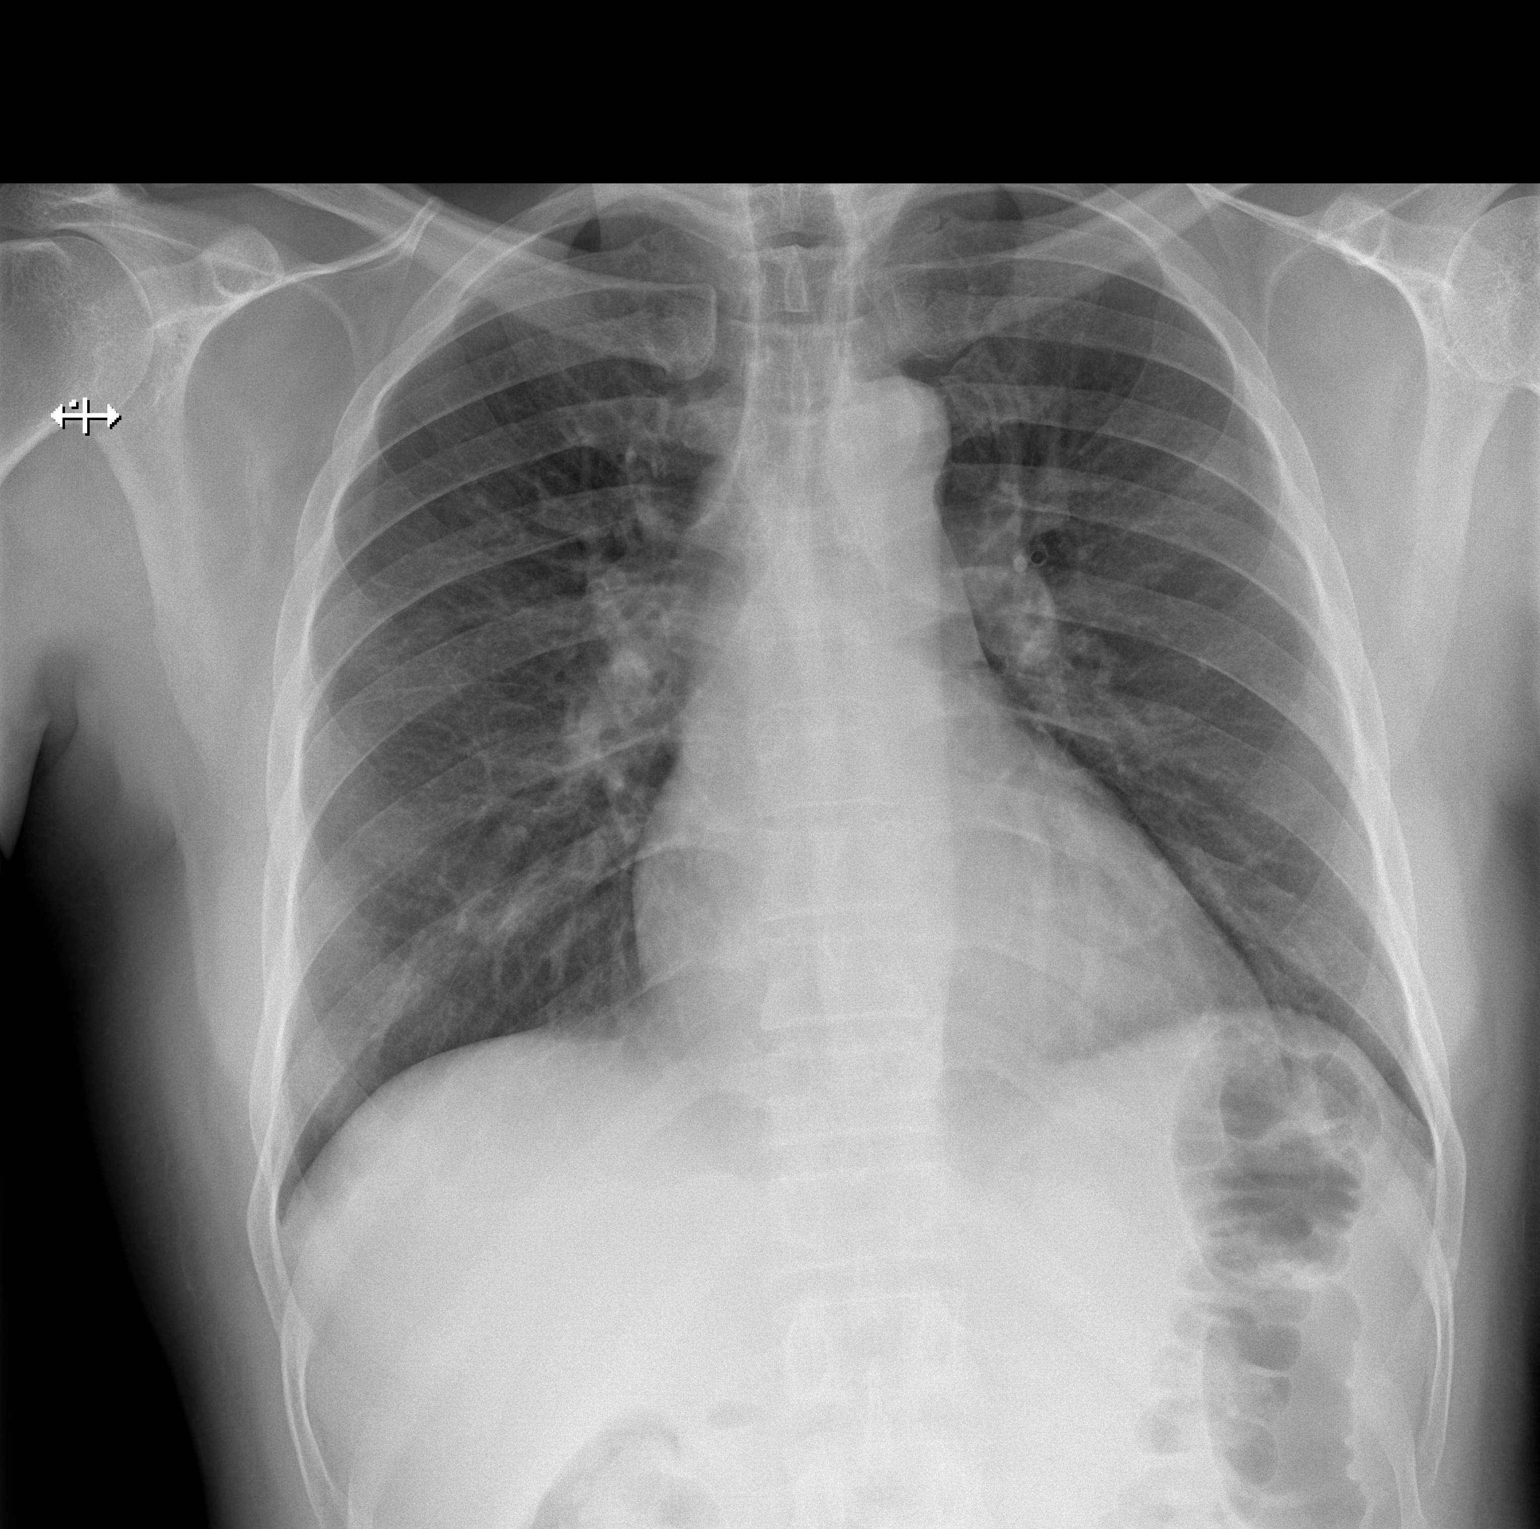

[w chest lat]
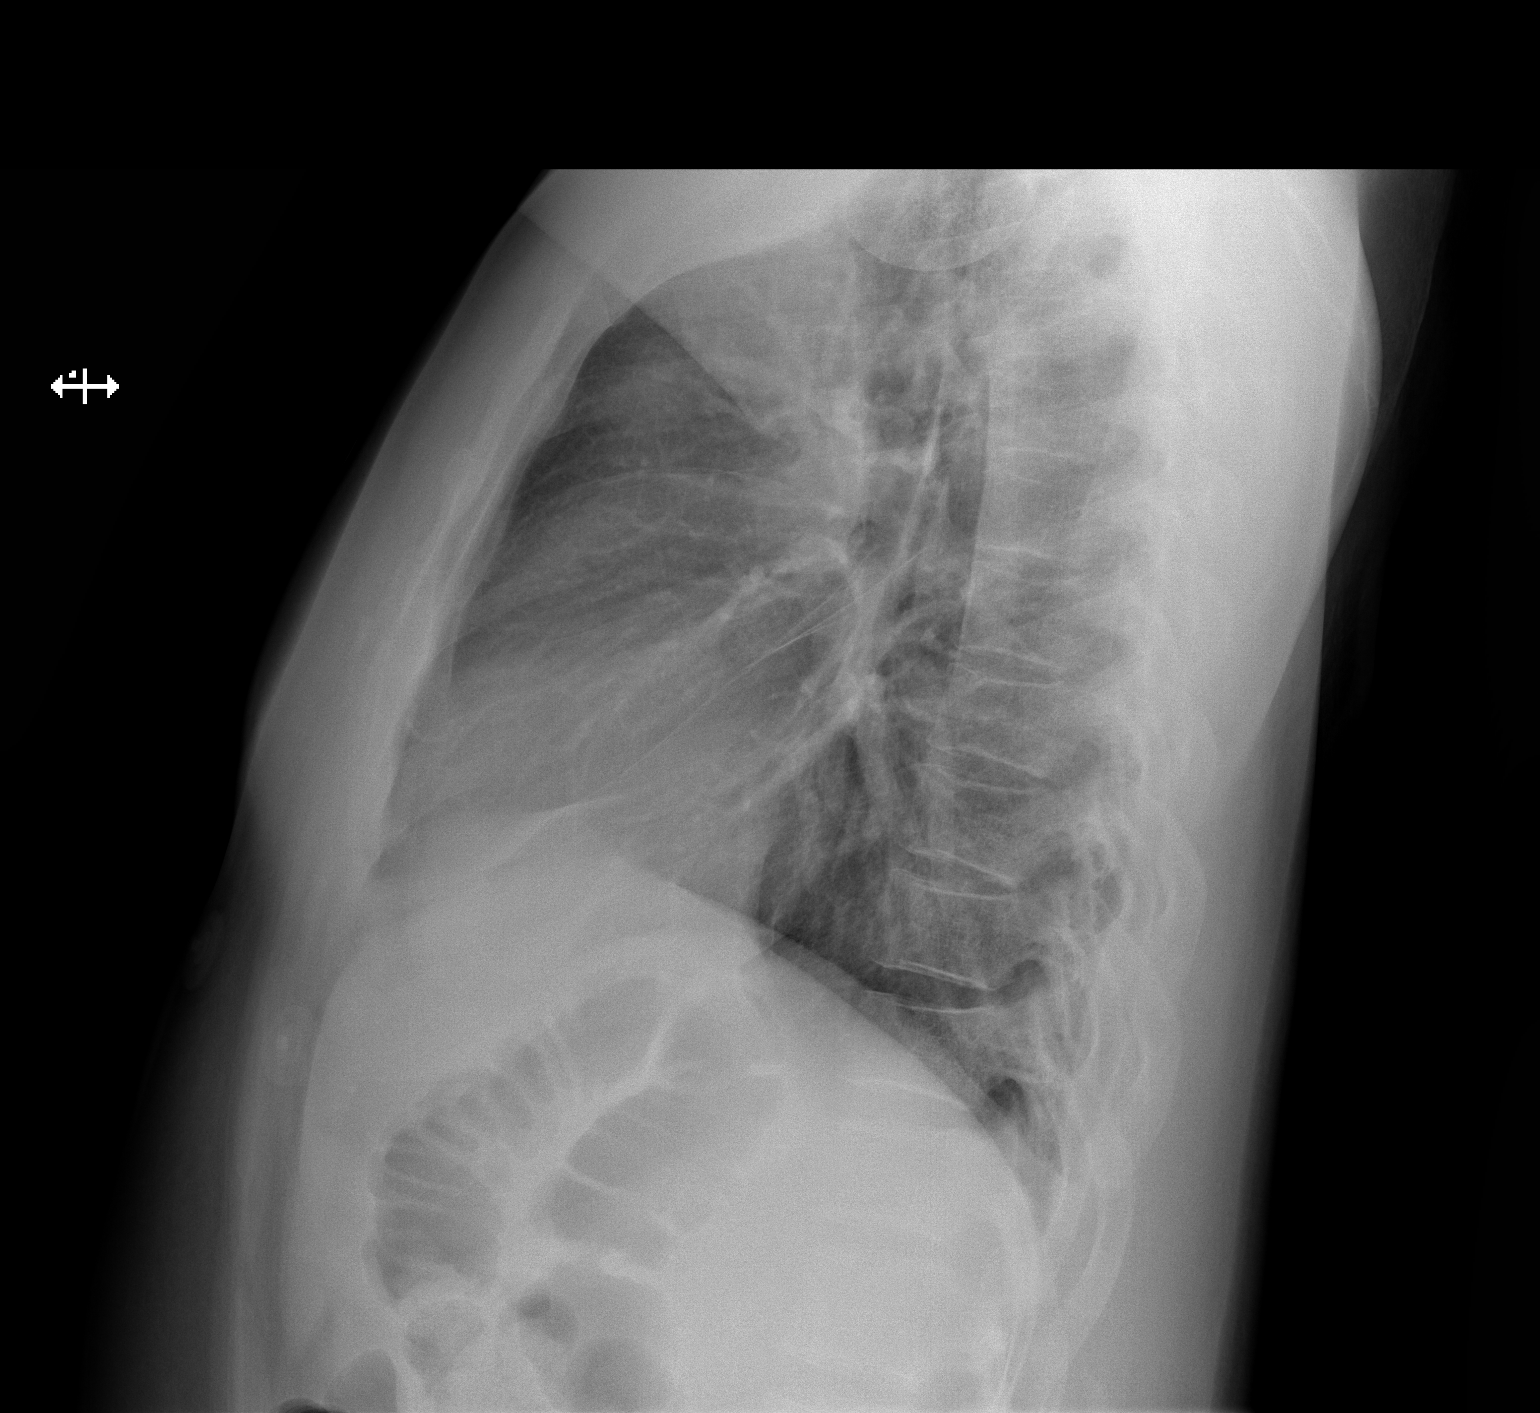

[2 of 2 positions shown; findings below may reference images not displayed]

FINDINGS: Heart size within normal limits. No evidence of airspace
consolidation. No pleural effusion or pneumothorax. No acute bony
abnormality.
IMPRESSION: No evidence of acute cardiopulmonary abnormality.

## 2021-09-16 IMAGING — DX DG CHEST 1V PORT
1 series · 1 of 1 positions shown · non-contrast
Comparison: Chest radiograph 02/10/2019

CLINICAL DATA: Patient with dizziness and shortness of breath.

EXAM:
PORTABLE CHEST 1 VIEW

[chest ap]
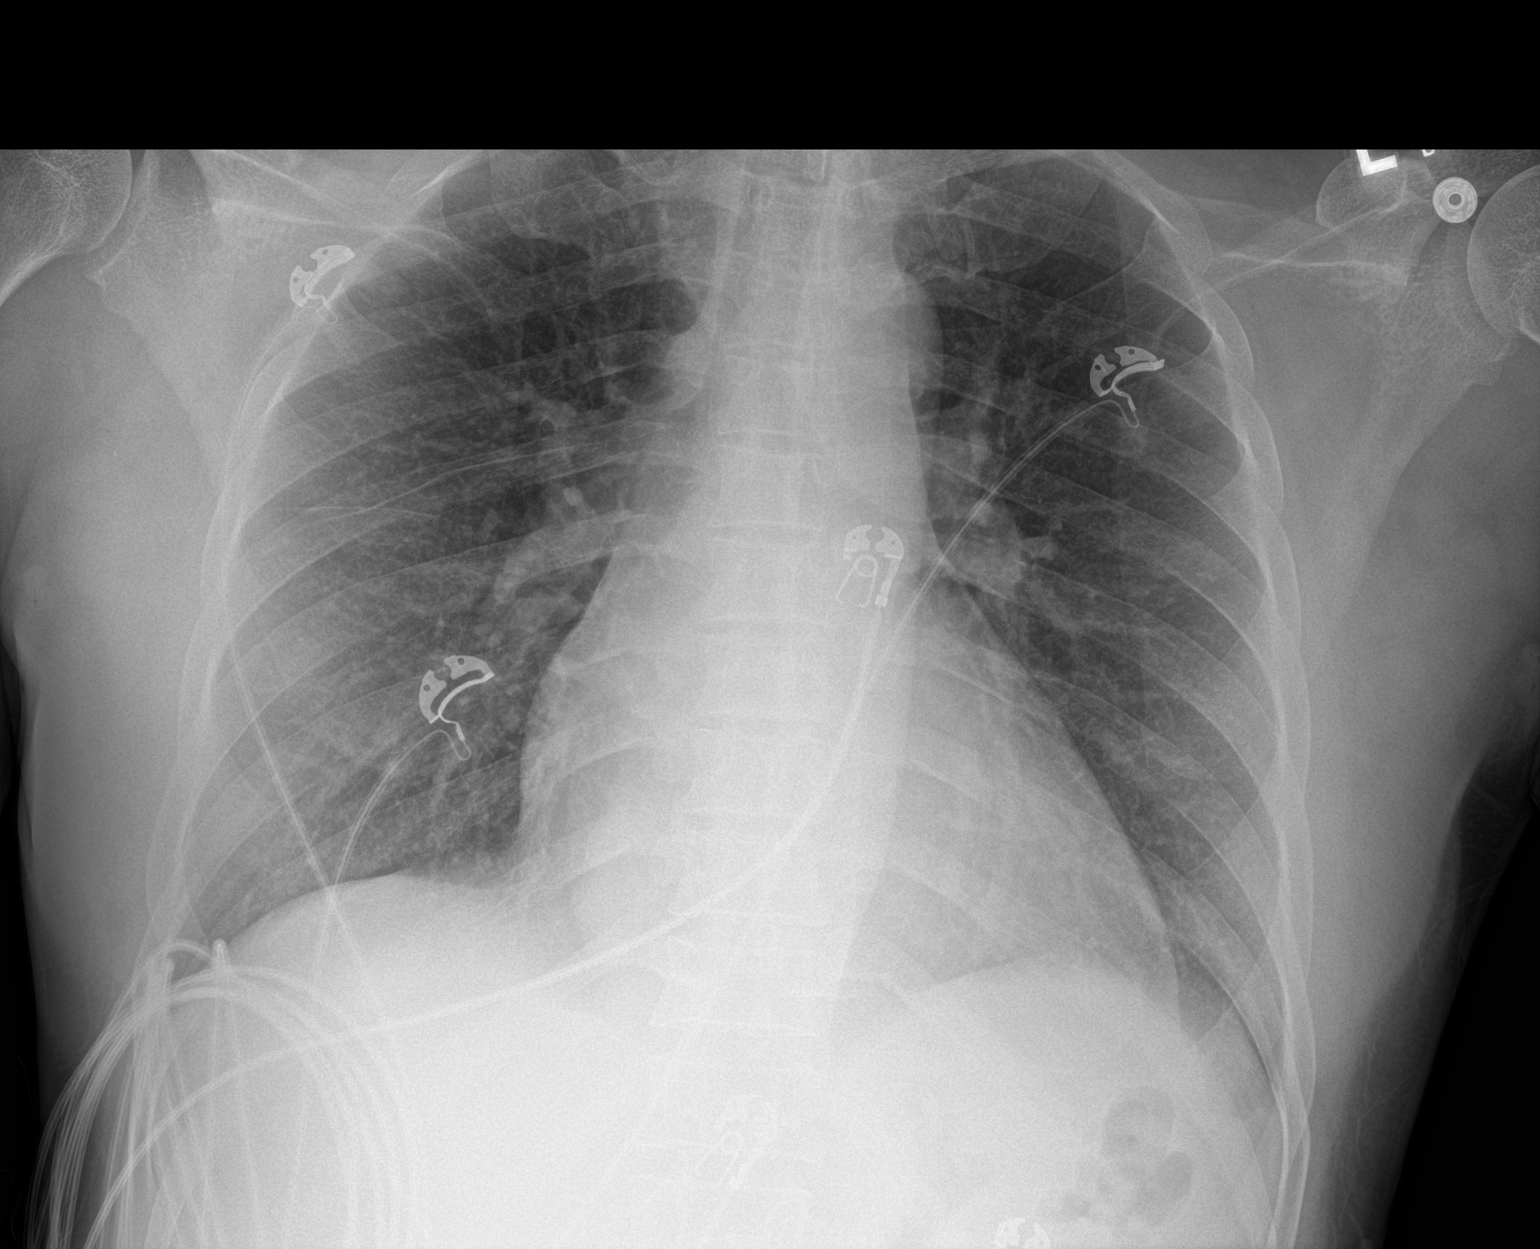

[1 of 1 positions shown; findings below may reference images not displayed]

FINDINGS: Monitoring leads overlie the patient. Normal cardiac and mediastinal
contours. No consolidative pulmonary opacities. No pleural effusion
or pneumothorax.
IMPRESSION: No acute cardiopulmonary process.

## 2021-10-05 IMAGING — US US THYROID
1 series · 13 of 25 positions shown · non-contrast
Comparison: None.

CLINICAL DATA: Palpable abnormality. Thyromegaly on physical
examination. Hyperthyroidism

EXAM:
THYROID ULTRASOUND
TECHNIQUE: Ultrasound examination of the thyroid gland and adjacent soft
tissues was performed.

[Series 1: us thyroid · 13 of 78 slices shown]
[im 1/78]
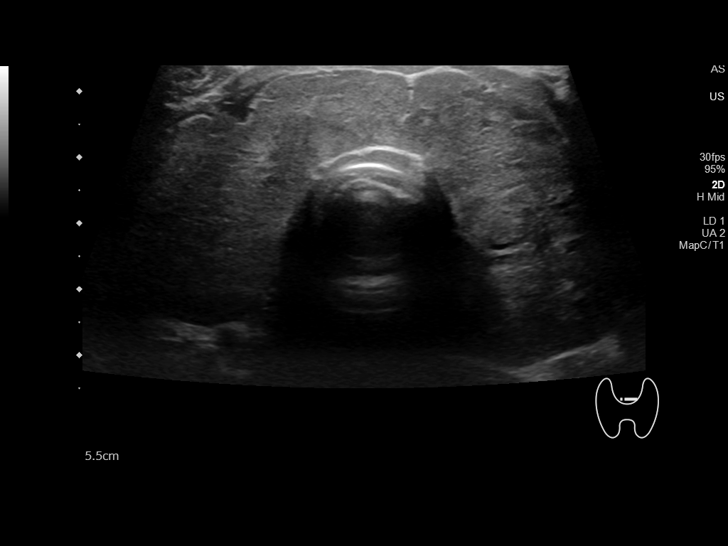
[im 7/78]
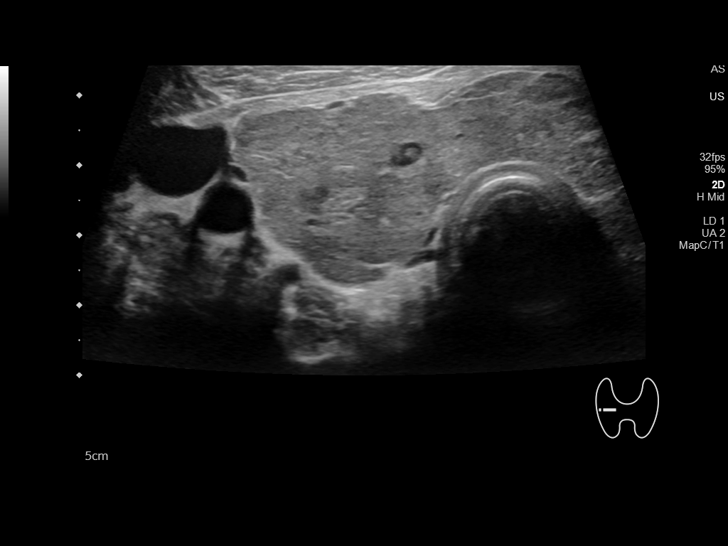
[im 13/78]
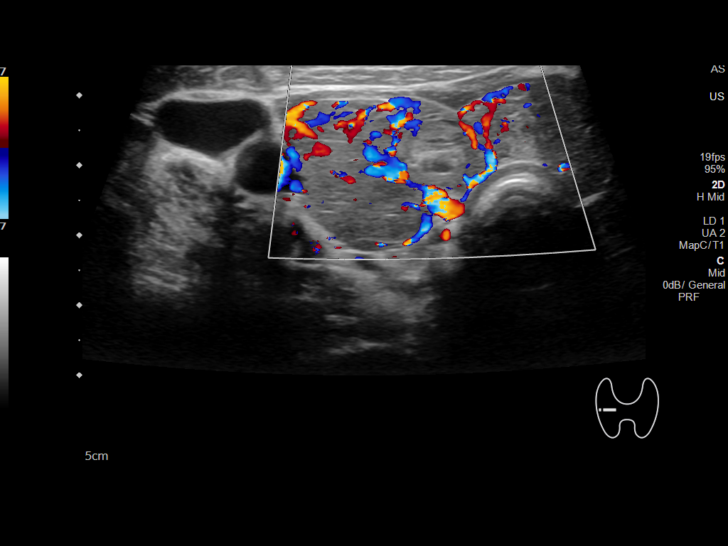
[im 20/78]
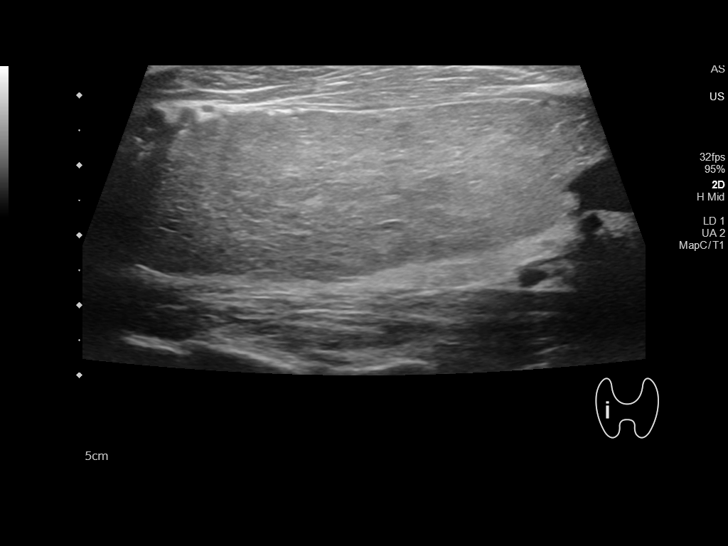
[im 26/78]
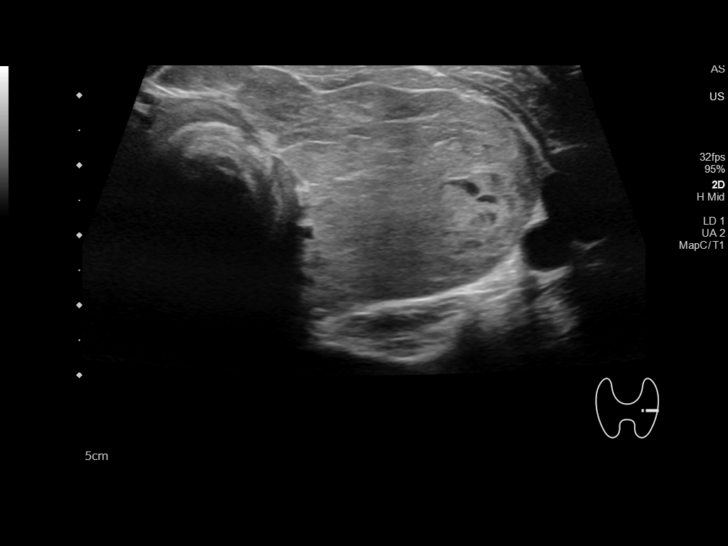
[im 33/78]
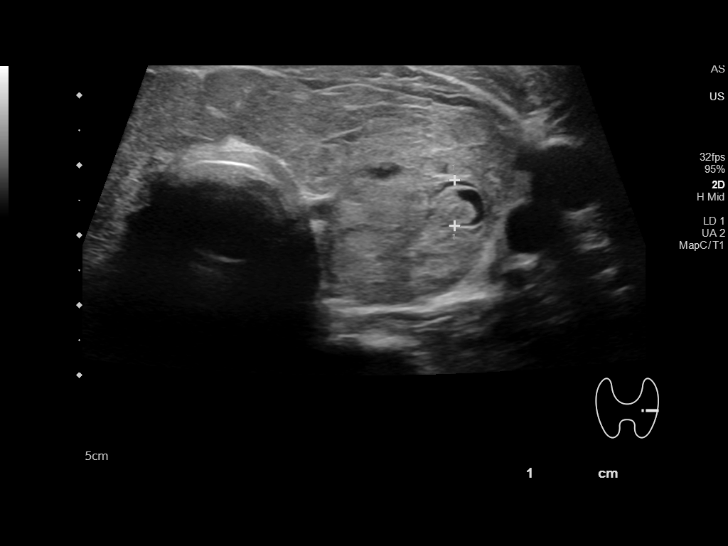
[im 39/78]
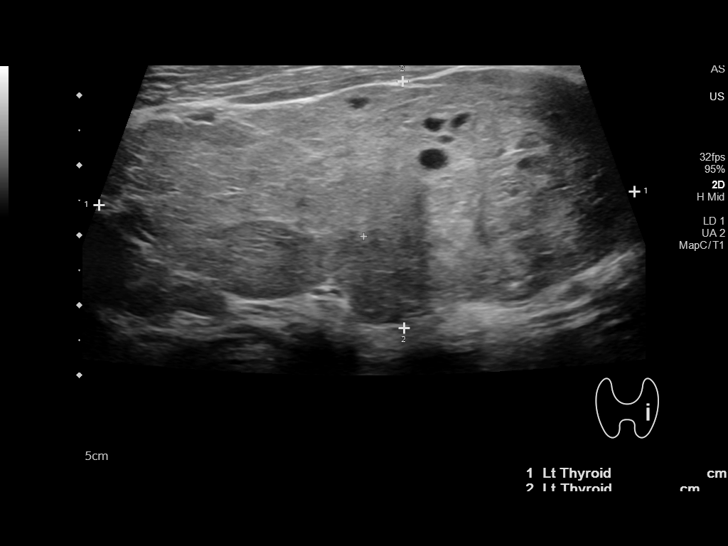
[im 45/78]
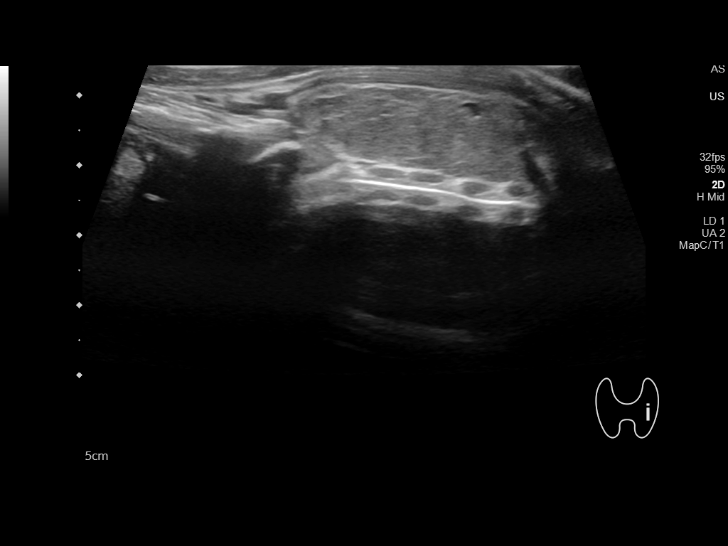
[im 52/78]
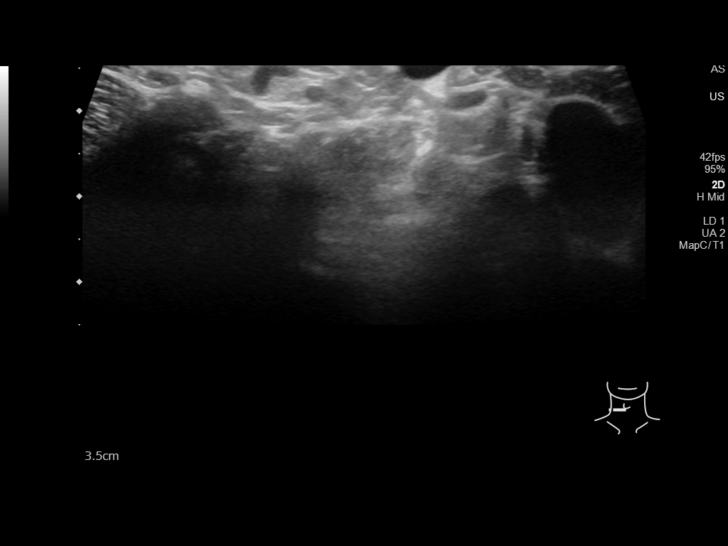
[im 58/78]
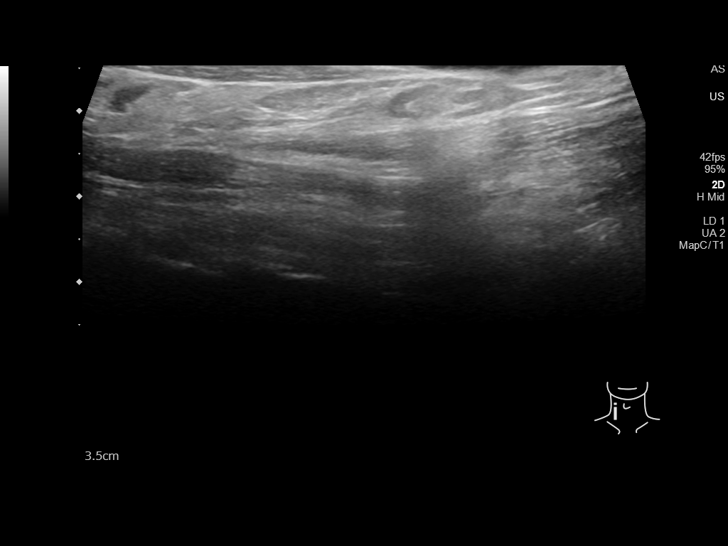
[im 65/78]
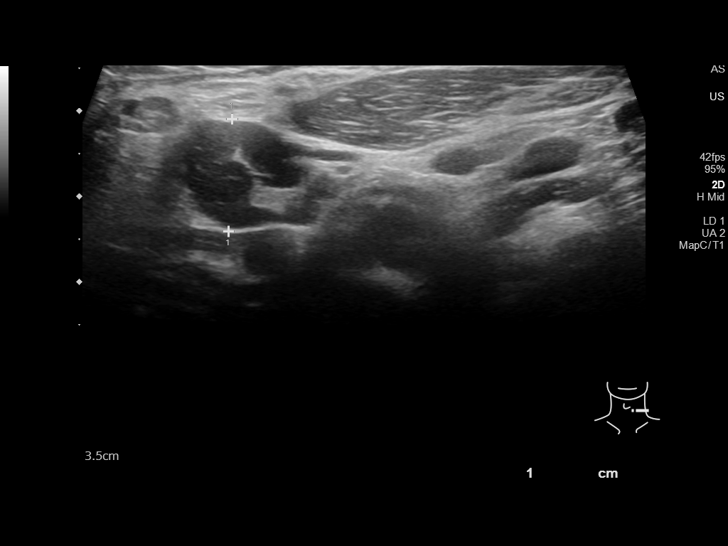
[im 71/78]
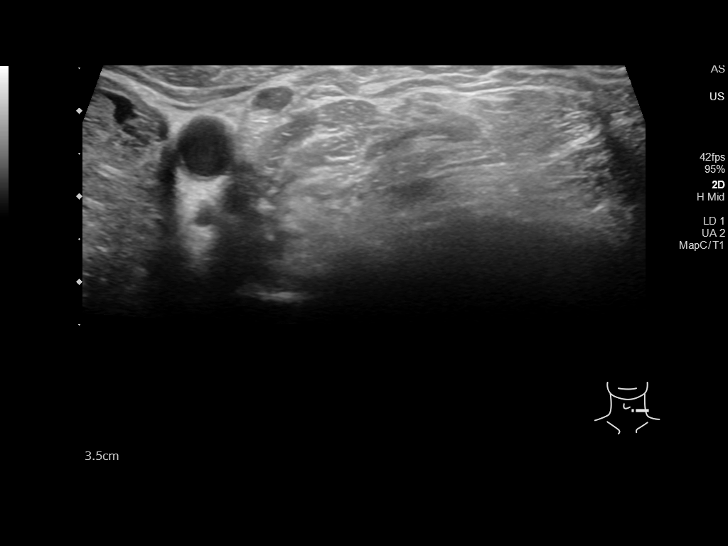
[im 78/78]
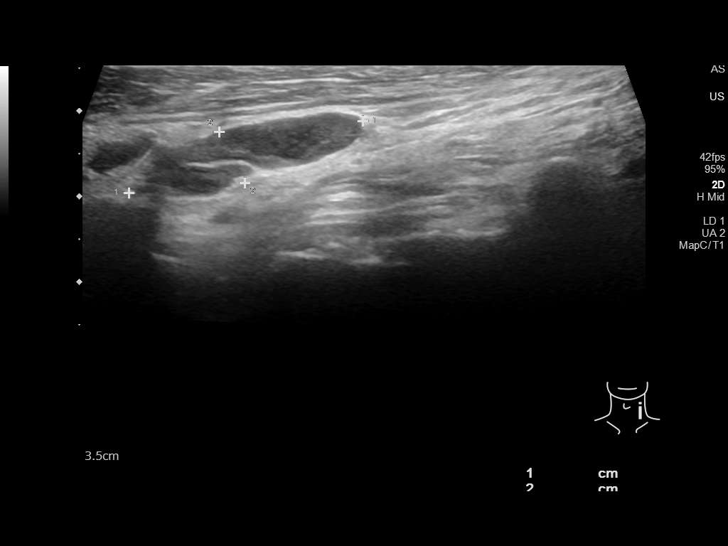

[13 of 25 positions shown; findings below may reference images not displayed]

FINDINGS: Parenchymal Echotexture: Moderately heterogenous diffuse glandular
hyperemia (images 13, 14 and 38.

Isthmus: Enlarged measuring 1.1 cm in diameter

Right lobe: Enlarged measuring 7.0 x 2.4 x 3.2 cm

Left lobe: Enlarged measuring 7.7 x 3.5 x 3.7 cm

_________________________________________________________

Estimated total number of nodules >/= 1 cm: 1

Number of spongiform nodules >/=  2 cm not described below (TR1): 0

Number of mixed cystic and solid nodules >/= 1.5 cm not described
below (TR2): 0

_________________________________________________________

There is an approximately 0.9 cm isoechoic ill-defined
nodule/pseudonodule within the mid aspect of the right lobe of the
thyroid which does not meet imaging criteria to recommend
percutaneous sampling or continued dedicated follow-up.

There is an approximately 0.7 cm partially cystic though
predominantly solid isoechoic nodule within the mid aspect of the
left lobe of the thyroid which does not meet criteria to recommend
percutaneous sampling or continued dedicated follow-up.

Questioned 1.2 cm isoechoic nodule within the mid aspect of the left
lobe of the thyroid is favored to represent a pseudonodule as it
lacks defined borders on the provided transverse image.

_________________________________________________________

Scattered bilateral cervical lymph nodes are prominent though
individually not enlarged by size criteria with index right cervical
lymph node measuring 0.8 cm in greatest short axis diameter (image
61) and index left-sided cervical lymph node measuring 0.7 cm (image
78), both of which maintaining benign fatty hila.
IMPRESSION: 1. Enlarged, moderately heterogeneous appearing and potentially
hyperemic thyroid gland without worrisome thyroid nodule or mass.
Findings are nonspecific though could be seen in the setting of an
acute thyroiditis. Clinical correlation is advised.
2. Scattered bilateral cervical lymph nodes are prominent though
individually not enlarged by size criteria, nonspecific though
presumably reactive in etiology.

The above is in keeping with the ACR TI-RADS recommendations - [HOSPITAL] 6766;[DATE].

## 2022-03-06 ENCOUNTER — Encounter (HOSPITAL_COMMUNITY): Payer: Self-pay | Admitting: *Deleted
